# Patient Record
Sex: Female | Born: 1955 | Race: Black or African American | Hispanic: No | Smoking: Never smoker
Health system: Southern US, Community
[De-identification: ages and names within clinical notes are randomized; demographics above are authoritative.]

## PROBLEM LIST (undated history)

## (undated) DIAGNOSIS — E7800 Pure hypercholesterolemia, unspecified: Secondary | ICD-10-CM

## (undated) DIAGNOSIS — J40 Bronchitis, not specified as acute or chronic: Secondary | ICD-10-CM

## (undated) HISTORY — PX: OTHER SURGICAL HISTORY: SHX169

---

## 2018-09-27 ENCOUNTER — Encounter (HOSPITAL_BASED_OUTPATIENT_CLINIC_OR_DEPARTMENT_OTHER): Payer: Self-pay | Admitting: Emergency Medicine

## 2018-09-27 ENCOUNTER — Other Ambulatory Visit: Payer: Self-pay

## 2018-09-27 ENCOUNTER — Emergency Department (HOSPITAL_BASED_OUTPATIENT_CLINIC_OR_DEPARTMENT_OTHER)
Admission: EM | Admit: 2018-09-27 | Discharge: 2018-09-27 | Disposition: A | Discharge status: Home / Self Care | Payer: Self-pay | Attending: Emergency Medicine | Admitting: Emergency Medicine

## 2018-09-27 DIAGNOSIS — M545 Low back pain, unspecified: Secondary | ICD-10-CM

## 2018-09-27 HISTORY — DX: Pure hypercholesterolemia, unspecified: E78.00

## 2018-09-27 LAB — URINALYSIS, ROUTINE W REFLEX MICROSCOPIC
Bilirubin Urine: NEGATIVE
Glucose, UA: NEGATIVE mg/dL
Hgb urine dipstick: NEGATIVE
Ketones, ur: NEGATIVE mg/dL
Leukocytes, UA: NEGATIVE
Nitrite: NEGATIVE
Protein, ur: NEGATIVE mg/dL
Specific Gravity, Urine: 1.025 (ref 1.005–1.030)
pH: 5.5 (ref 5.0–8.0)

## 2018-09-27 MED ORDER — NAPROXEN 500 MG PO TABS: 500.0000 mg | ORAL_TABLET | Freq: Two times a day (BID) | ORAL | 0 refills | Status: DC

## 2018-09-27 MED ORDER — IBUPROFEN 400 MG PO TABS
400.0000 mg | ORAL_TABLET | Freq: Once | ORAL | Status: AC
  Administered 2018-09-27: 400 mg via ORAL
  Filled 2018-09-27: qty 1

## 2018-09-27 NOTE — Discharge Instructions (Addendum)
Today you were evaluated for back pain.  1. Medications: naproxen, usual home medications 2. Treatment: rest, drink plenty of fluids, gentle stretching as discussed, alternate ice and heat 3. Follow Up: Please follow up with your primary doctor in 3 days for discussion of your diagnoses and further evaluation after today's visit; if you do not have a primary care doctor use the resource guide provided to find one;  Return to the ER for worsening back pain, difficulty walking, loss of bowel or bladder control or other concerning symptoms. Follow up with orthopedics if not improving.  I have prescribed you an anti-inflammatory medication.   Naproxen is a nonsteroidal anti-inflammatory medication that will help with pain and swelling. Be sure to take this medication as prescribed with food, 1 pill every 12 hours,  It should be taken with food, as it can cause stomach upset, and more seriously, stomach bleeding. Do not take other nonsteroidal anti-inflammatory medications with this such as Advil, Motrin, or Aleve.   In addition you may also take Tylenol. Tylenol is generally safe, though you should not take more than 8 of the extra strength (500mg ) pills a day.  The application of heat can help soothe the pain.  Maintaining your daily activities, including walking, is encourged, as it will help you get better faster than just staying in bed.  Low back pain is discomfort in the lower back that may be due to injuries to muscles and ligaments around the spine.  Occasionally, it may be caused by a a problem to a part of the spine called a disc.  The pain may last several days or a few weeks.   Your pain should get better over the next 2 weeks.  You will need to follow up with your primary healthcare provider in 1-2 weeks for reassessment, if you do not have a primary care provider one is provided in your discharge instructions. However if you develop severe or worsening pain, low back pain with fever,  numbness, weakness, loss of bowel or bladder control, or inability to walk or urinate, you should return to the ER immediately.  Please follow up with your doctor this week for a recheck if still having symptoms.  Back Pain:  Your back pain should be treated with medicines such as ibuprofen or aleve and this back pain should get better over the next 2 weeks.  However if you develop severe or worsening pain, low back pain with fever, numbness, weakness or inability to walk or urinate, you should return to the ER immediately.  Please follow up with your doctor this week for a recheck if still having symptoms.  Low back pain is discomfort in the lower back that may be due to injuries to muscles and ligaments around the spine.  Occasionally, it may be caused by a a problem to a part of the spine called a disc.  The pain may last several days or a week;  However, most patients get completely well in 4 weeks.  Low back pain is very common. About 1 in 5 people have back pain. The cause of low back pain is rarely dangerous. The pain often gets better over time. About half of people with a sudden onset of back pain feel better in just 2 weeks. About 8 in 10 people feel better by 6 weeks.   CAUSES Some common causes of back pain include: Strain of the muscles or ligaments supporting the spine.  Wear and tear (degeneration) of the spinal  discs.  Arthritis.  Direct injury to the back.   DIAGNOSIS Most of the time, the direct cause of low back pain is not known. However, back pain can be treated effectively even when the exact cause of the pain is unknown. Answering your caregiver's questions about your overall health and symptoms is one of the most accurate ways to make sure the cause of your pain is not dangerous. If your caregiver needs more information, he or she may order lab work or imaging tests (X-rays or MRIs). However, even if imaging tests show changes in your back, this usually does not require  surgery.  HOME CARE INSTRUCTIONS For many people, back pain returns. Since low back pain is rarely dangerous, it is often a condition that people can learn to manage on their own.  Remain active. It is stressful on the back to sit or stand in one place. Do not sit, drive, or stand in one place for more than 30 minutes at a time. Take short walks on level surfaces as soon as pain allows. Try to increase the length of time you walk each day.  Do not stay in bed. Resting more than 1 or 2 days can delay your recovery.  Do not avoid exercise or work. Your body is made to move. It is not dangerous to be active, even though your back may hurt. Your back will likely heal faster if you return to being active before your pain is gone.  Pay attention to your body when you  bend and lift. Many people have less discomfort when lifting if they bend their knees, keep the load close to their bodies, and avoid twisting. Often, the most comfortable positions are those that put less stress on your recovering back.  Find a comfortable position to sleep. Use a firm mattress and lie on your side with your knees slightly bent. If you lie on your back, put a pillow under your knees.  Only take over-the-counter or prescription medicines as directed by your caregiver. Over-the-counter medicines to reduce pain and inflammation are often the most helpful. Your caregiver may prescribe muscle relaxant drugs. These medicines help dull your pain so you can more quickly return to your normal activities and healthy exercise.  Put ice on the injured area.  Put ice in a plastic bag.  Place a towel between your skin and the bag.  Leave the ice on for 15 to 20 minutes, 3 to 4 times a day for the first 2 to 3 days. After that, ice and heat may be alternated to reduce pain and spasms.  Ask your caregiver about trying back exercises and gentle massage. This may be of some benefit.  Avoid feeling anxious or stressed. Stress increases muscle  tension and can worsen back pain. It is important to recognize when you are anxious or stressed and learn ways to manage it. Exercise is a great option.   SEEK MEDICAL CARE IF: You have pain that is not relieved with rest or medicine.  You have pain that does not improve in 1 week.  You have new symptoms.  You are generally not feeling well.   SEEK IMMEDIATE MEDICAL CARE IF:  You have pain that radiates from your back into your legs.  You develop new bowel or bladder control problems.  You have unusual weakness or numbness in your arms or legs.  You develop nausea or vomiting.  You develop abdominal pain.  You feel faint.   Be aware that  if you develop new symptoms, such as a fever, leg weakness, difficulty with or loss of control of your urine or bowels, abdominal pain, or more severe pain, you will need to seek medical attention and  / or return to the Emergency department.  If you do not have a doctor see the list below.

## 2018-09-27 NOTE — ED Provider Notes (Signed)
[facility] EMERGENCY DEPARTMENT Provider Note   CSN: [id number] Arrival date & time: 09/27/18  1218   History   Chief Complaint Chief Complaint  Patient presents with  . Back Pain    HPI Patricia Spencer is a 62 y.o. female presenting with intermittent non radiating low back pain onset 3 days ago. Patient describes her back pain as an ache and states it is worse with twisting. Patient states she has tried advil with partial relief. Patient states back pain began after she had her birthday party and played with her grandchildren. Patient denies any trauma. Patient denies dysuria, fever, abdominal pain, nausea, vomiting, diarrhea, or trouble walking. Denies numbness, tingling, weakness, incontinence to bowel/bladder, fever, chills, IV drug use, or hx of cancer.    HPI  Past Medical History:  Diagnosis Date  . High cholesterol     There are no active problems to display for this patient.   History reviewed. No pertinent surgical history.   OB History   No obstetric history on file.      Home Medications    Prior to Admission medications   Medication Sig Start Date End Date Taking? Authorizing Provider  naproxen (NAPROSYN) 500 MG tablet Take 1 tablet (500 mg total) by mouth 2 (two) times daily. 09/27/18   Leretha DykesHernandez, Bowen Kia P, PA-C    Family History No family history on file.  Social History Social History   Tobacco Use  . Smoking status: Never Smoker  . Smokeless tobacco: Never Used  Substance Use Topics  . Alcohol use: Not Currently  . Drug use: Never     Allergies   Patient has no known allergies.   Review of Systems Review of Systems  Constitutional: Negative for activity change, chills, diaphoresis, fever and unexpected weight change.  Respiratory: Negative for cough and shortness of breath.   Cardiovascular: Negative for chest pain, palpitations and leg swelling.  Gastrointestinal: Negative for abdominal pain, constipation, diarrhea, nausea  and vomiting.  Genitourinary: Negative for difficulty urinating, dysuria, flank pain and hematuria.  Musculoskeletal: Positive for back pain. Negative for arthralgias, gait problem, joint swelling, myalgias, neck pain and neck stiffness.  Skin: Negative for rash.  Allergic/Immunologic: Negative for immunocompromised state.  Neurological: Negative for dizziness, syncope, weakness and numbness.  Hematological: Does not bruise/bleed easily.     Physical Exam Updated Vital Signs BP (!) 135/93 (BP Location: Right Arm)   Pulse 79   Temp 98.1 F (36.7 C) (Oral)   Resp 14   Ht 5\' 7"  (1.702 m)   Wt 68.9 kg   SpO2 100%   BMI 23.81 kg/m   Physical Exam Vitals signs and nursing note reviewed.  Constitutional:      General: She is not in acute distress.    Appearance: She is well-developed. She is not diaphoretic.  HENT:     Head: Normocephalic and atraumatic.  Cardiovascular:     Rate and Rhythm: Normal rate and regular rhythm.     Heart sounds: Normal heart sounds. No murmur. No friction rub. No gallop.   Pulmonary:     Effort: Pulmonary effort is normal. No respiratory distress.     Breath sounds: Normal breath sounds. No wheezing or rales.  Musculoskeletal: Normal range of motion.     Cervical back: Normal.     Thoracic back: Normal.     Lumbar back: She exhibits tenderness. She exhibits normal range of motion, no bony tenderness and no swelling.     Comments: No signs of  erythema or edema noted on exam. No midline tenderness noted on thoracic or lumbar spine. Mild paraspinal tenderness bilaterally on lumbar muscles. Patient has full ROM with flexion and extension of spine. 5/5 strength with dorsiflexion and plantar flexion. Negative straight leg test. 2+DP pulses. Sensation is intact. Patient is able to ambulate without difficulty.   Skin:    Findings: No erythema or rash.  Neurological:     Mental Status: She is alert and oriented to person, place, and time.      ED  Treatments / Results  Labs (all labs ordered are listed, but only abnormal results are displayed) Labs Reviewed  URINALYSIS, ROUTINE W REFLEX MICROSCOPIC    EKG None  Radiology No results found.  Procedures Procedures (including critical care time)  Medications Ordered in ED Medications  ibuprofen (ADVIL,MOTRIN) tablet 400 mg (400 mg Oral Given 09/27/18 1445)     Initial Impression / Assessment and Plan / ED Course  I have reviewed the triage vital signs and the nursing notes.  Pertinent labs & imaging results that were available during my care of the patient were reviewed by me and considered in my medical decision making (see chart for details).    Patient with back pain.  Suspect back pain is musculoskeletal due to history and physical exam. No neurological deficits and normal neuro exam.  Patient can walk without pain.  No loss of bowel or bladder control.  No concern for cauda equina.  No fever, night sweats, weight loss, h/o cancer, IVDU. UA is negative. RICE protocol and pain medicine indicated and discussed with patient.    Final Clinical Impressions(s) / ED Diagnoses   Final diagnoses:  Acute bilateral low back pain without sciatica    ED Discharge Orders         Ordered    naproxen (NAPROSYN) 500 MG tablet  2 times daily     09/27/18 1448           Carlyle Basques [place], [place] 09/27/18 1449    Rolan Bucco, MD 09/27/18 [phone]

## 2018-09-27 NOTE — ED Triage Notes (Addendum)
Low back pain x 3 days. Denies injury. Took advil 1 hour ago. Concerned about UTI, denies urinary symptoms.

## 2018-10-25 ENCOUNTER — Emergency Department (HOSPITAL_COMMUNITY)
Admission: EM | Admit: 2018-10-25 | Discharge: 2018-10-25 | Disposition: A | Discharge status: Home / Self Care | Payer: BLUE CROSS/BLUE SHIELD | Attending: Emergency Medicine | Admitting: Emergency Medicine

## 2018-10-25 ENCOUNTER — Encounter (HOSPITAL_COMMUNITY): Payer: Self-pay

## 2018-10-25 DIAGNOSIS — J111 Influenza due to unidentified influenza virus with other respiratory manifestations: Secondary | ICD-10-CM

## 2018-10-25 DIAGNOSIS — Z79899 Other long term (current) drug therapy: Secondary | ICD-10-CM | POA: Diagnosis not present

## 2018-10-25 DIAGNOSIS — R69 Illness, unspecified: Secondary | ICD-10-CM

## 2018-10-25 DIAGNOSIS — J101 Influenza due to other identified influenza virus with other respiratory manifestations: Secondary | ICD-10-CM | POA: Diagnosis not present

## 2018-10-25 DIAGNOSIS — R05 Cough: Secondary | ICD-10-CM | POA: Diagnosis present

## 2018-10-25 MED ORDER — CETIRIZINE HCL 5 MG PO TABS: 5.0000 mg | ORAL_TABLET | Freq: Every day | ORAL | 0 refills | Status: DC

## 2018-10-25 MED ORDER — BENZONATATE 100 MG PO CAPS: 200.0000 mg | ORAL_CAPSULE | Freq: Three times a day (TID) | ORAL | 0 refills | Status: DC

## 2018-10-25 MED ORDER — ONDANSETRON 4 MG PO TBDP: 4.0000 mg | ORAL_TABLET | Freq: Three times a day (TID) | ORAL | 0 refills | Status: DC | PRN

## 2018-10-25 MED ORDER — FLUTICASONE PROPIONATE 50 MCG/ACT NA SUSP: 1.0000 | Freq: Every day | NASAL | 2 refills | Status: DC

## 2018-10-25 MED ORDER — ONDANSETRON HCL 4 MG PO TABS
4.0000 mg | ORAL_TABLET | Freq: Once | ORAL | Status: AC
  Administered 2018-10-25: 4 mg via ORAL
  Filled 2018-10-25: qty 1

## 2018-10-25 NOTE — ED Notes (Signed)
Patient verbalizes understanding of discharge instructions. Opportunity for questioning and answers were provided. Armband removed by staff, pt discharged from ED ambulatory.   

## 2018-10-25 NOTE — ED Provider Notes (Signed)
[facility]ENC[facility] Provider Note   CSN: [id number] Arrival date & time: 10/25/18  [id number]     History   Chief Complaint Chief Complaint  Patient presents with  . URI    HPI Patricia Spencer is a 63 y.o. female who presents to ED for 1 day history of influenza-like illness including nausea, body aches, chills, cough productive with mucus, sinus pain and pressure, rhinorrhea.  Sick contacts around her with similar symptoms.  She has not been taking any medications to help with her symptoms.  She not receive her influenza vaccine this year.  She denies any recent travel, vomiting, abdominal pain, shortness of breath, sore throat.  HPI  Past Medical History:  Diagnosis Date  . High cholesterol     There are no active problems to display for this patient.   History reviewed. No pertinent surgical history.   OB History   No obstetric history on file.      Home Medications    Prior to Admission medications   Medication Sig Start Date End Date Taking? Authorizing Provider  benzonatate (TESSALON) 100 MG capsule Take 2 capsules (200 mg total) by mouth every 8 (eight) hours. 10/25/18   Nelida Mandarino, PA-C  cetirizine (ZYRTEC) 5 MG tablet Take 1 tablet (5 mg total) by mouth daily. 10/25/18   Caio Devera, PA-C  fluticasone (FLONASE) 50 MCG/ACT nasal spray Place 1 spray into both nostrils daily. 10/25/18   Aries Townley, PA-C  naproxen (NAPROSYN) 500 MG tablet Take 1 tablet (500 mg total) by mouth 2 (two) times daily. 09/27/18   Carlyle BasquesHernandez, Ana P, PA-C  ondansetron (ZOFRAN ODT) 4 MG disintegrating tablet Take 1 tablet (4 mg total) by mouth every 8 (eight) hours as needed for nausea or vomiting. 10/25/18   Dietrich PatesKhatri, Arienne Gartin, PA-C    Family History No family history on file.  Social History Social History   Tobacco Use  . Smoking status: Never Smoker  . Smokeless tobacco: Never Used  Substance Use Topics  . Alcohol use: Not Currently  . Drug use: Never      Allergies   Patient has no known allergies.   Review of Systems Review of Systems  Constitutional: Positive for chills. Negative for fever.  HENT: Positive for ear pain, rhinorrhea, sinus pressure and sinus pain. Negative for congestion, sore throat and trouble swallowing.   Respiratory: Positive for cough.   Gastrointestinal: Positive for nausea. Negative for abdominal pain and vomiting.  Musculoskeletal: Positive for myalgias.     Physical Exam Updated Vital Signs BP (!) 162/100 (BP Location: Right Arm)   Pulse (!) 109   Temp 99.7 F (37.6 C) (Oral)   Resp 20   SpO2 97%   Physical Exam Vitals signs and nursing note reviewed.  Constitutional:      General: She is not in acute distress.    Appearance: She is well-developed. She is not diaphoretic.  HENT:     Head: Normocephalic and atraumatic.     Right Ear: A middle ear effusion is present.     Left Ear: A middle ear effusion is present.     Nose:     Right Sinus: Maxillary sinus tenderness present.     Left Sinus: Maxillary sinus tenderness present.     Mouth/Throat:     Pharynx: Oropharynx is clear. Uvula midline.     Tonsils: No tonsillar exudate or tonsillar abscesses.  Eyes:     General: No scleral icterus.    Conjunctiva/sclera: Conjunctivae  normal.  Neck:     Musculoskeletal: Normal range of motion.  Cardiovascular:     Rate and Rhythm: Normal rate and regular rhythm.     Heart sounds: Normal heart sounds.  Pulmonary:     Effort: Pulmonary effort is normal. No respiratory distress.     Breath sounds: Normal breath sounds.  Skin:    Findings: No rash.  Neurological:     Mental Status: She is alert.      ED Treatments / Results  Labs (all labs ordered are listed, but only abnormal results are displayed) Labs Reviewed - No data to display  EKG None  Radiology No results found.  Procedures Procedures (including critical care time)  Medications Ordered in ED Medications  ondansetron  (ZOFRAN) tablet 4 mg (has no administration in time range)     Initial Impression / Assessment and Plan / ED Course  I have reviewed the triage vital signs and the nursing notes.  Pertinent labs & imaging results that were available during my care of the patient were reviewed by me and considered in my medical decision making (see chart for details).     Patient with symptoms consistent with influenza.  Vitals are stable, low-grade fever.  No signs of dehydration, tolerating PO's.  Lungs are clear. Due to patient's presentation and physical exam a chest x-ray was not ordered bc likely diagnosis of flu.  Discussed the cost versus benefit of Tamiflu treatment with the patient.  The patient declines Tamiflu and would instead like symptomatic treatment.  Patient will be discharged with instructions to orally hydrate, rest, and use over-the-counter medications such as anti-inflammatories ibuprofen and Aleve for muscle aches and Tylenol for fever.  Patient will also be given symptomatic treatment.  Patient is hemodynamically stable, in NAD, and able to ambulate in the ED. Evaluation does not show pathology that would require ongoing emergent intervention or inpatient treatment. I explained the diagnosis to the patient. Pain has been managed and has no complaints prior to discharge. Patient is comfortable with above plan and is stable for discharge at this time. All questions were answered prior to disposition. Strict return precautions for returning to the ED were discussed. Encouraged follow up with PCP.    Portions of this note were generated with Scientist, clinical (histocompatibility and immunogenetics)Dragon dictation software. Dictation errors may occur despite best attempts at proofreading.  Final Clinical Impressions(s) / ED Diagnoses   Final diagnoses:  Influenza-like illness    ED Discharge Orders         Ordered    ondansetron (ZOFRAN ODT) 4 MG disintegrating tablet  Every 8 hours PRN     10/25/18 0814    fluticasone (FLONASE) 50 MCG/ACT  nasal spray  Daily     10/25/18 0814    cetirizine (ZYRTEC) 5 MG tablet  Daily     10/25/18 0814    benzonatate (TESSALON) 100 MG capsule  Every 8 hours     10/25/18 0814           Dietrich PatesKhatri, Castulo Scarpelli, PA-C 10/25/18 0816    Virgina Norfolkuratolo, Adam, DO 10/25/18 1039

## 2018-10-25 NOTE — Discharge Instructions (Addendum)
Take the medication as needed for your symptoms. Please make sure you are staying hydrated. Return to ED for worsening symptoms, abdominal pain, chest pain, shortness of breath, vomiting or coughing up blood.

## 2018-10-25 NOTE — ED Triage Notes (Signed)
Pt presents for evaluation of flu like symptoms starting yesterday. Chills, fever, nausea, runny nose.

## 2018-10-29 ENCOUNTER — Emergency Department (HOSPITAL_COMMUNITY): Payer: BLUE CROSS/BLUE SHIELD

## 2018-10-29 ENCOUNTER — Emergency Department (HOSPITAL_COMMUNITY)
Admission: EM | Admit: 2018-10-29 | Discharge: 2018-10-29 | Disposition: A | Discharge status: Home / Self Care | Payer: BLUE CROSS/BLUE SHIELD | Attending: Emergency Medicine | Admitting: Emergency Medicine

## 2018-10-29 DIAGNOSIS — R0981 Nasal congestion: Secondary | ICD-10-CM | POA: Diagnosis present

## 2018-10-29 DIAGNOSIS — E7800 Pure hypercholesterolemia, unspecified: Secondary | ICD-10-CM | POA: Insufficient documentation

## 2018-10-29 DIAGNOSIS — J069 Acute upper respiratory infection, unspecified: Secondary | ICD-10-CM | POA: Diagnosis not present

## 2018-10-29 NOTE — ED Provider Notes (Signed)
[facility] EMERGENCY DEPARTMENT Provider Note   CSN: [id number] Arrival date & time: 10/29/18  0845     History   Chief Complaint No chief complaint on file.   HPI Patricia Spencer is a 63 y.o. female.  HPI   63 year old female presents today with complaints of upper respiratory infection.  She notes symptoms started approximately 5 days ago rhinorrhea nasal congestion and cough.  She notes body aches.  She notes feeling hot at night.  No medications prior to arrival.  Patient notes that she has been using antibiotics that she has had at home and prednisone.  Past Medical History:  Diagnosis Date  . High cholesterol     There are no active problems to display for this patient.   No past surgical history on file.   OB History   No obstetric history on file.      Home Medications    Prior to Admission medications   Medication Sig Start Date End Date Taking? Authorizing Provider  benzonatate (TESSALON) 100 MG capsule Take 2 capsules (200 mg total) by mouth every 8 (eight) hours. 10/25/18   Khatri, Hina, PA-C  cetirizine (ZYRTEC) 5 MG tablet Take 1 tablet (5 mg total) by mouth daily. 10/25/18   Khatri, Hina, PA-C  fluticasone (FLONASE) 50 MCG/ACT nasal spray Place 1 spray into both nostrils daily. 10/25/18   Khatri, Hina, PA-C  naproxen (NAPROSYN) 500 MG tablet Take 1 tablet (500 mg total) by mouth 2 (two) times daily. 09/27/18   Carlyle Basques P, PA-C  ondansetron (ZOFRAN ODT) 4 MG disintegrating tablet Take 1 tablet (4 mg total) by mouth every 8 (eight) hours as needed for nausea or vomiting. 10/25/18   Dietrich Pates, PA-C    Family History No family history on file.  Social History Social History   Tobacco Use  . Smoking status: Never Smoker  . Smokeless tobacco: Never Used  Substance Use Topics  . Alcohol use: Not Currently  . Drug use: Never     Allergies   Patient has no known allergies.   Review of Systems Review of Systems  All  other systems reviewed and are negative.    Physical Exam Updated Vital Signs BP 139/83 (BP Location: Left Arm)   Pulse 97   Temp 98.2 F (36.8 C) (Oral)   Resp 18   SpO2 99%   Physical Exam Vitals signs and nursing note reviewed.  Constitutional:      Appearance: She is well-developed.  HENT:     Head: Normocephalic and atraumatic.     Comments: Oropharynx clear no erythema edema or exudate Eyes:     General: No scleral icterus.       Right eye: No discharge.        Left eye: No discharge.     Conjunctiva/sclera: Conjunctivae normal.     Pupils: Pupils are equal, round, and reactive to light.  Neck:     Musculoskeletal: Normal range of motion.     Vascular: No JVD.     Trachea: No tracheal deviation.  Pulmonary:     Effort: Pulmonary effort is normal. No respiratory distress.     Breath sounds: No stridor. No wheezing, rhonchi or rales.  Neurological:     Mental Status: She is alert and oriented to person, place, and time.     Coordination: Coordination normal.  Psychiatric:        Behavior: Behavior normal.        Thought Content: Thought content  normal.        Judgment: Judgment normal.      ED Treatments / Results  Labs (all labs ordered are listed, but only abnormal results are displayed) Labs Reviewed - No data to display  EKG None  Radiology Dg Chest 2 View  Result Date: 10/29/2018 CLINICAL DATA:  Cough and chest pain EXAM: CHEST - 2 VIEW COMPARISON:  None. FINDINGS: There is slight scarring in the lateral bases. Lungs elsewhere are clear. The heart size and pulmonary vascularity are normal. No adenopathy. No bone lesions. No pneumothorax. IMPRESSION: No edema or consolidation.  No evident adenopathy. Electronically Signed   By: Bretta Bang III M.D.   On: 10/29/2018 09:44    Procedures Procedures (including critical care time)  Medications Ordered in ED Medications - No data to display   Initial Impression / Assessment and Plan / ED Course   I have reviewed the triage vital signs and the nursing notes.  Pertinent labs & imaging results that were available during my care of the patient were reviewed by me and considered in my medical decision making (see chart for details).     Patient presents with likely viral URI.  She has no objective signs of infection presently negative chest x-ray.  Patient discharged with outpatient follow-up.  I did have a discussion with the patient and the need for a primary care provider and utilization of their resources.  Final Clinical Impressions(s) / ED Diagnoses   Final diagnoses:  Viral upper respiratory tract infection    ED Discharge Orders    None       Rosalio Loud 10/29/18 1012    Jacalyn Lefevre, MD 10/29/18 1235

## 2018-10-29 NOTE — ED Triage Notes (Signed)
Pt back to today to make sure she does not have PNA , pt was here 3 days ago but states that she is not feeling any better

## 2018-10-29 NOTE — Discharge Instructions (Addendum)
Please read attached information. If you experience any new or worsening signs or symptoms please return to the emergency room for evaluation. Please follow-up with your primary care provider or specialist as discussed.  °

## 2018-12-10 ENCOUNTER — Encounter (HOSPITAL_BASED_OUTPATIENT_CLINIC_OR_DEPARTMENT_OTHER): Payer: Self-pay | Admitting: *Deleted

## 2018-12-10 ENCOUNTER — Emergency Department (HOSPITAL_BASED_OUTPATIENT_CLINIC_OR_DEPARTMENT_OTHER): Payer: BLUE CROSS/BLUE SHIELD

## 2018-12-10 ENCOUNTER — Other Ambulatory Visit: Payer: Self-pay

## 2018-12-10 ENCOUNTER — Emergency Department (HOSPITAL_BASED_OUTPATIENT_CLINIC_OR_DEPARTMENT_OTHER)
Admission: EM | Admit: 2018-12-10 | Discharge: 2018-12-10 | Disposition: A | Discharge status: Home / Self Care | Payer: BLUE CROSS/BLUE SHIELD | Attending: Emergency Medicine | Admitting: Emergency Medicine

## 2018-12-10 DIAGNOSIS — W19XXXA Unspecified fall, initial encounter: Secondary | ICD-10-CM

## 2018-12-10 DIAGNOSIS — S0990XA Unspecified injury of head, initial encounter: Secondary | ICD-10-CM | POA: Insufficient documentation

## 2018-12-10 DIAGNOSIS — Y9389 Activity, other specified: Secondary | ICD-10-CM | POA: Insufficient documentation

## 2018-12-10 DIAGNOSIS — Y929 Unspecified place or not applicable: Secondary | ICD-10-CM | POA: Insufficient documentation

## 2018-12-10 DIAGNOSIS — Y999 Unspecified external cause status: Secondary | ICD-10-CM | POA: Insufficient documentation

## 2018-12-10 DIAGNOSIS — W1789XA Other fall from one level to another, initial encounter: Secondary | ICD-10-CM | POA: Insufficient documentation

## 2018-12-10 HISTORY — DX: Bronchitis, not specified as acute or chronic: J40

## 2018-12-10 NOTE — ED Provider Notes (Signed)
[facility] EMERGENCY DEPARTMENT Provider Note   CSN: [id number] Arrival date & time: 12/10/18  1824    History   Chief Complaint Chief Complaint  Patient presents with  . Fall    HPI Patricia Spencer is a 63 y.o. female who presents today for evaluation after a fall.  She reports that she was not wearing a helmet when she was riding on her grandchild's hover board and lost her balance falling back striking her head.  Reportedly initially her words were garbled and slurred, this lasted for approximately 30 seconds according to patient and her grandson.  She did not have loss of consciousness.  She does not take any blood thinning medications.  She reports pain in her head and in her left elbow stating that she struck her left elbow.  She has been ambulatory since and was able to drive here.     HPI  Past Medical History:  Diagnosis Date  . Bronchitis   . High cholesterol     There are no active problems to display for this patient.   History reviewed. No pertinent surgical history.   OB History   No obstetric history on file.      Home Medications    Prior to Admission medications   Medication Sig Start Date End Date Taking? Authorizing Provider  benzonatate (TESSALON) 100 MG capsule Take 2 capsules (200 mg total) by mouth every 8 (eight) hours. 10/25/18   Khatri, Hina, PA-C  cetirizine (ZYRTEC) 5 MG tablet Take 1 tablet (5 mg total) by mouth daily. 10/25/18   Khatri, Hina, PA-C  fluticasone (FLONASE) 50 MCG/ACT nasal spray Place 1 spray into both nostrils daily. 10/25/18   Khatri, Hina, PA-C  naproxen (NAPROSYN) 500 MG tablet Take 1 tablet (500 mg total) by mouth 2 (two) times daily. 09/27/18   Carlyle Basques P, PA-C  ondansetron (ZOFRAN ODT) 4 MG disintegrating tablet Take 1 tablet (4 mg total) by mouth every 8 (eight) hours as needed for nausea or vomiting. 10/25/18   Dietrich Pates, PA-C    Family History No family history on file.  Social History Social  History   Tobacco Use  . Smoking status: Never Smoker  . Smokeless tobacco: Never Used  Substance Use Topics  . Alcohol use: Not Currently  . Drug use: Never     Allergies   Patient has no known allergies.   Review of Systems Review of Systems  Constitutional: Negative for chills and fever.  Eyes: Negative for pain and visual disturbance.  Neurological: Positive for speech difficulty and headaches. Negative for dizziness.  All other systems reviewed and are negative.    Physical Exam Updated Vital Signs BP (!) 168/88 (BP Location: Right Arm)   Pulse 98   Temp 98.4 F (36.9 C) (Oral)   Resp 16   Ht 5\' 8"  (1.727 m)   Wt 69.4 kg   SpO2 100%   BMI 23.26 kg/m   Physical Exam Vitals signs and nursing note reviewed.  Constitutional:      General: She is not in acute distress.    Appearance: She is well-developed. She is not ill-appearing.  HENT:     Head: Normocephalic and atraumatic.     Comments: No raccoon's eyes or battle signs bilaterally.    Right Ear: Tympanic membrane normal.     Left Ear: Tympanic membrane normal.     Nose: Nose normal.     Mouth/Throat:     Mouth: Mucous membranes are moist.  Eyes:     Conjunctiva/sclera: Conjunctivae normal.  Neck:     Musculoskeletal: Normal range of motion and neck supple. No neck rigidity.  Cardiovascular:     Rate and Rhythm: Normal rate and regular rhythm.     Pulses: Normal pulses.     Heart sounds: No murmur.  Pulmonary:     Effort: Pulmonary effort is normal. No respiratory distress.     Breath sounds: Normal breath sounds.  Abdominal:     Palpations: Abdomen is soft.     Tenderness: There is no abdominal tenderness.  Musculoskeletal:     Comments: Patient has full range of motion of left elbow without crepitus or deformity.  Skin:    General: Skin is warm and dry.  Neurological:     General: No focal deficit present.     Mental Status: She is alert and oriented to person, place, and time.      Comments: Mental Status:  Alert, oriented, thought content appropriate, able to give a coherent history. Speech fluent without evidence of aphasia. Able to follow 2 step commands without difficulty.  Cranial Nerves:  II:  Peripheral visual fields grossly normal, pupils equal, round, reactive to light III,IV, VI: ptosis not present, extra-ocular motions intact bilaterally  V,VII: smile symmetric, facial light touch sensation equal VIII: hearing grossly normal to voice  X: uvula elevates symmetrically  XI: bilateral shoulder shrug symmetric and strong XII: midline tongue extension without fassiculations Motor:  Normal tone. 5/5 in upper and lower extremities bilaterally including strong and equal grip strength and dorsiflexion/plantar flexion Cerebellar: normal finger-to-nose with bilateral upper extremities Gait: normal gait and balance CV: distal pulses palpable throughout    Psychiatric:        Mood and Affect: Mood normal.        Behavior: Behavior normal.      ED Treatments / Results  Labs (all labs ordered are listed, but only abnormal results are displayed) Labs Reviewed - No data to display  EKG None  Radiology Ct Head Wo Contrast  Result Date: 12/10/2018 CLINICAL DATA:  Fall off hoverboard.  Hit head. EXAM: CT HEAD WITHOUT CONTRAST CT CERVICAL SPINE WITHOUT CONTRAST TECHNIQUE: Multidetector CT imaging of the head and cervical spine was performed following the standard protocol without intravenous contrast. Multiplanar CT image reconstructions of the cervical spine were also generated. COMPARISON:  None. FINDINGS: CT HEAD FINDINGS Brain: No acute intracranial abnormality. Specifically, no hemorrhage, hydrocephalus, mass lesion, acute infarction, or significant intracranial injury. Vascular: No hyperdense vessel or unexpected calcification. Skull: No acute calvarial abnormality. Sinuses/Orbits: Visualized paranasal sinuses and mastoids clear. Orbital soft tissues unremarkable.  Other: None CT CERVICAL SPINE FINDINGS Alignment: Normal alignment Skull base and vertebrae: No acute fracture. No primary bone lesion or focal pathologic process. Soft tissues and spinal canal: No prevertebral fluid or swelling. No visible canal hematoma. Disc levels: Degenerative disc disease at C5-6 with disc space narrowing and spurring. Diffuse degenerative facet disease bilaterally, left greater than right. Upper chest: No acute findings Other: None IMPRESSION: No intracranial abnormality. No acute bony abnormality in the cervical spine. Electronically Signed   By: Charlett Nose M.D.   On: 12/10/2018 19:22   Ct Cervical Spine Wo Contrast  Result Date: 12/10/2018 CLINICAL DATA:  Fall off hoverboard.  Hit head. EXAM: CT HEAD WITHOUT CONTRAST CT CERVICAL SPINE WITHOUT CONTRAST TECHNIQUE: Multidetector CT imaging of the head and cervical spine was performed following the standard protocol without intravenous contrast. Multiplanar CT image reconstructions of the cervical  spine were also generated. COMPARISON:  None. FINDINGS: CT HEAD FINDINGS Brain: No acute intracranial abnormality. Specifically, no hemorrhage, hydrocephalus, mass lesion, acute infarction, or significant intracranial injury. Vascular: No hyperdense vessel or unexpected calcification. Skull: No acute calvarial abnormality. Sinuses/Orbits: Visualized paranasal sinuses and mastoids clear. Orbital soft tissues unremarkable. Other: None CT CERVICAL SPINE FINDINGS Alignment: Normal alignment Skull base and vertebrae: No acute fracture. No primary bone lesion or focal pathologic process. Soft tissues and spinal canal: No prevertebral fluid or swelling. No visible canal hematoma. Disc levels: Degenerative disc disease at C5-6 with disc space narrowing and spurring. Diffuse degenerative facet disease bilaterally, left greater than right. Upper chest: No acute findings Other: None IMPRESSION: No intracranial abnormality. No acute bony abnormality in the  cervical spine. Electronically Signed   By: Charlett Nose M.D.   On: 12/10/2018 19:22    Procedures Procedures (including critical care time)  Medications Ordered in ED Medications - No data to display   Initial Impression / Assessment and Plan / ED Course  I have reviewed the triage vital signs and the nursing notes.  Pertinent labs & imaging results that were available during my care of the patient were reviewed by me and considered in my medical decision making (see chart for details).       Patient presents today for evaluation after she fell off a hover board striking the back of her head.  She reports initially her words were garbled.  CT scan of head and neck were obtained without evidence of acute abnormality.  On exam she is neurologically intact.  Suspect concussion.  She is slightly hypertensive while in the emergency room, she was informed of this and the need to follow-up in the next week for recheck.  She is given information for the concussion clinic.  Return precautions were discussed with patient who states their understanding.  At the time of discharge patient denied any unaddressed complaints or concerns.  Patient is agreeable for discharge home.   Final Clinical Impressions(s) / ED Diagnoses   Final diagnoses:  Fall, initial encounter  Injury of head, initial encounter    ED Discharge Orders    None       Norman Clay 12/11/18 0002    Geoffery Lyons, MD 12/13/18 1108

## 2018-12-10 NOTE — ED Triage Notes (Signed)
On hoover board and fell backwards and hit back of head on concrete  Denies loc  Jumped as soon as fell  States words were garbled for a few minutes  Also left elbow pain

## 2018-12-10 NOTE — Discharge Instructions (Signed)
Please do not use a hover board.  If you choose to use 1 please make sure that you, and anyone else to use it, or wearing a helmet.  I have given you the information for the concussion clinic.  Please call them to schedule an appointment.  You may follow-up with your primary care doctor.  If your symptoms worsen or you have concerns please seek additional medical care and evaluation.

## 2018-12-11 ENCOUNTER — Telehealth: Payer: Self-pay

## 2018-12-11 ENCOUNTER — Ambulatory Visit: Payer: BLUE CROSS/BLUE SHIELD | Admitting: Family Medicine

## 2018-12-11 NOTE — Telephone Encounter (Signed)
Spoke with patient. She was on a hoverboard yesterday when she fell off backwards hitting posterior aspect of her head. She said that she did not lose consciousness but was slurring her speech when she tried to speak to her grandson. She had an intense headache and took some Advil which improved her pain. No headache today but wants to be seen to make sure she is ok. On schedule after lunch today.

## 2019-04-13 IMAGING — CT CT CERVICAL SPINE W/O CM
4 of 8 series · 13 of 33 positions shown, 14 images · non-contrast
Comparison: None.

CLINICAL DATA: Fall off hoverboard.  Hit head.

EXAM:
CT HEAD WITHOUT CONTRAST
CT CERVICAL SPINE WITHOUT CONTRAST
TECHNIQUE: Multidetector CT imaging of the head and cervical spine was
performed following the standard protocol without intravenous
contrast. Multiplanar CT image reconstructions of the cervical spine
were also generated.

[Series 4: head 3.0 mpr cor · coronal · 0.33mm/px · 3 of 69 slices shown]
[im 18/69  bone]
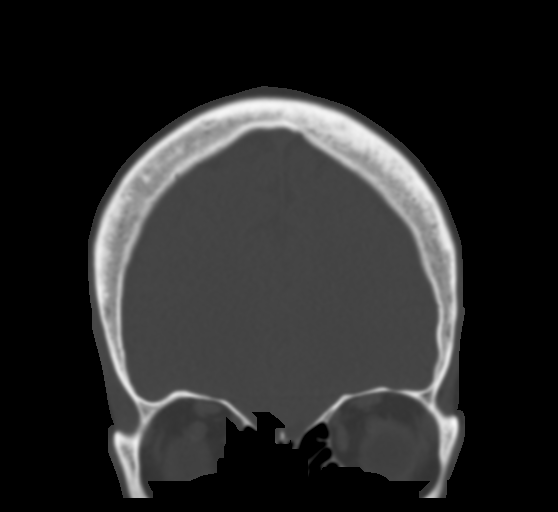
[im 35/69  bone]
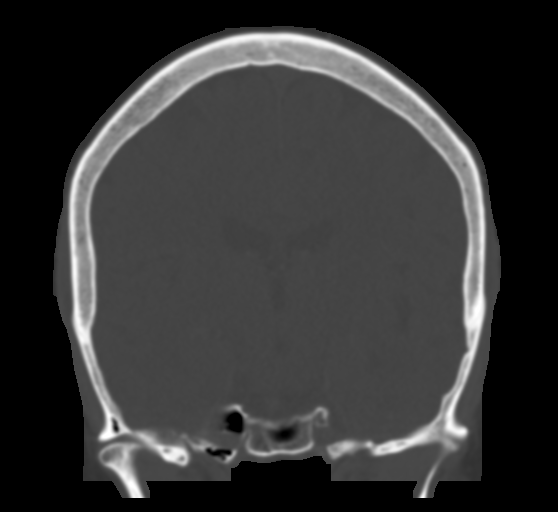
[im 52/69  bone]
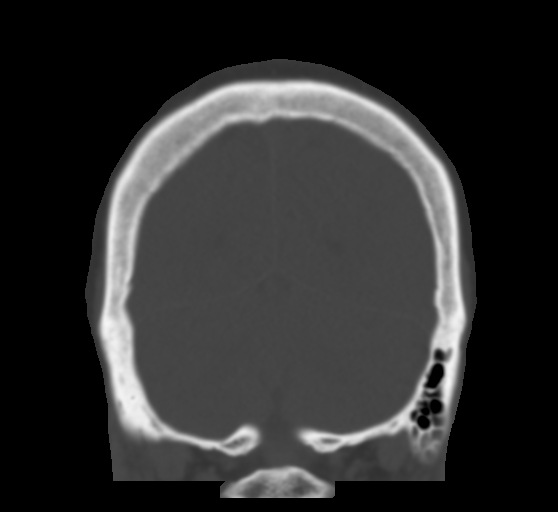

[Series 7: c_spine 2.0 i30s 3 · axial · 0.43mm/px · z∈[+1002,+1062]mm · 2 of 92 slices shown]
[im 31/92  bone]
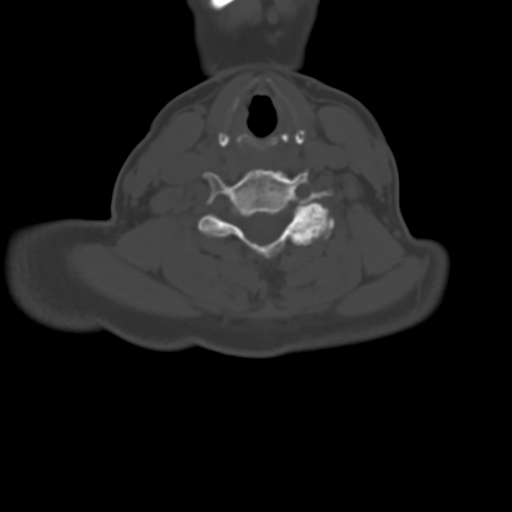
[im 61/92  bone]
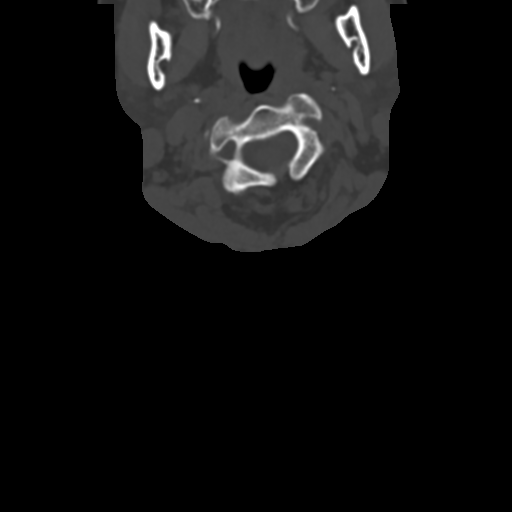

[Series 10: sagittals · sagittal · 0.26mm/px · 5 of 78 slices shown]
[im 13/78  bone]
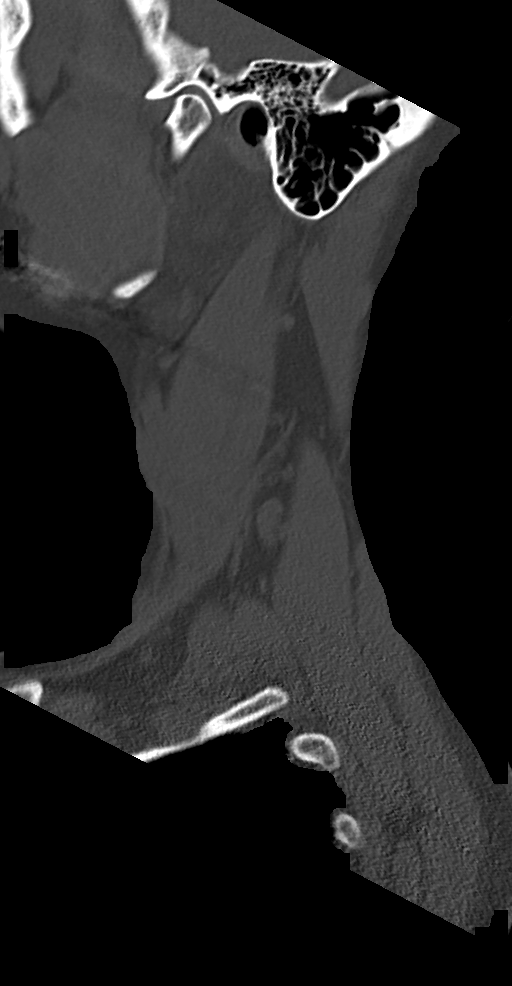
[im 26/78  bone]
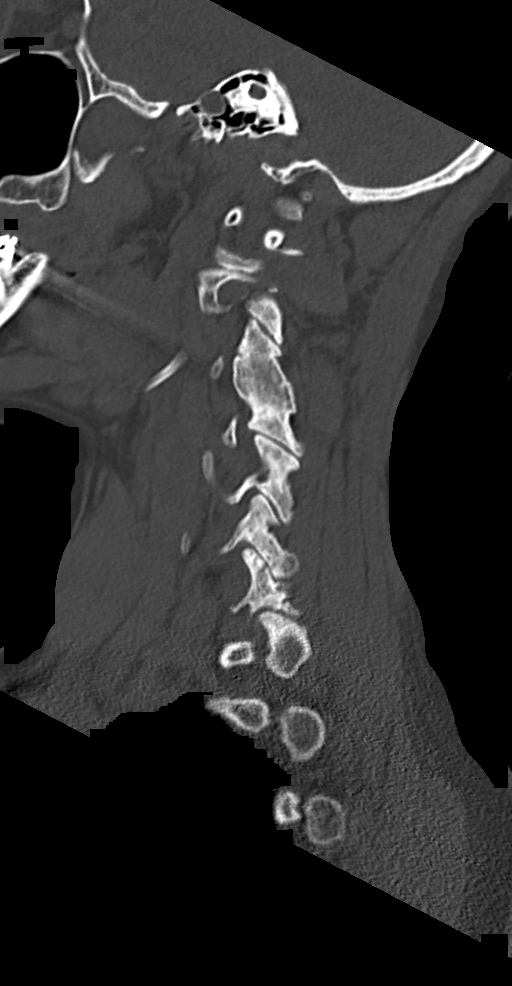
[im 39/78  bone]
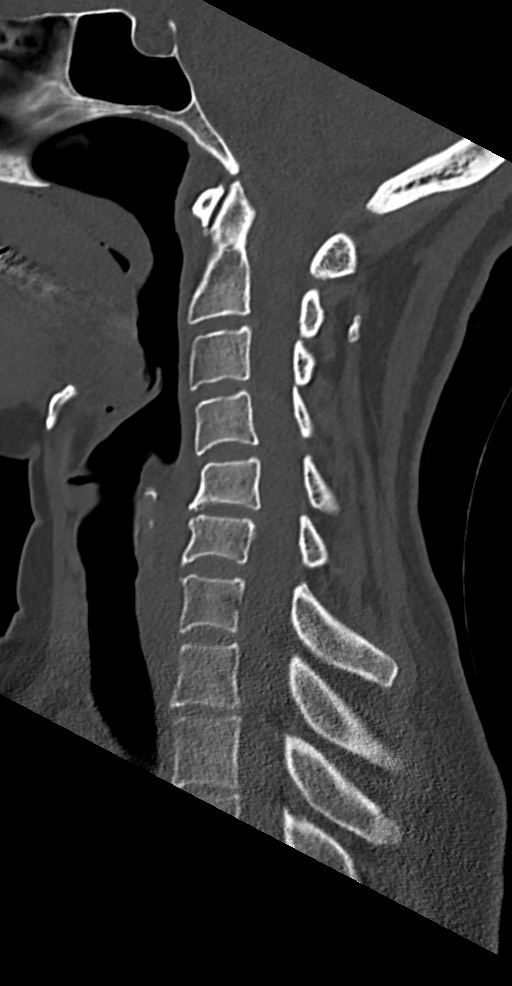
[im 52/78  bone]
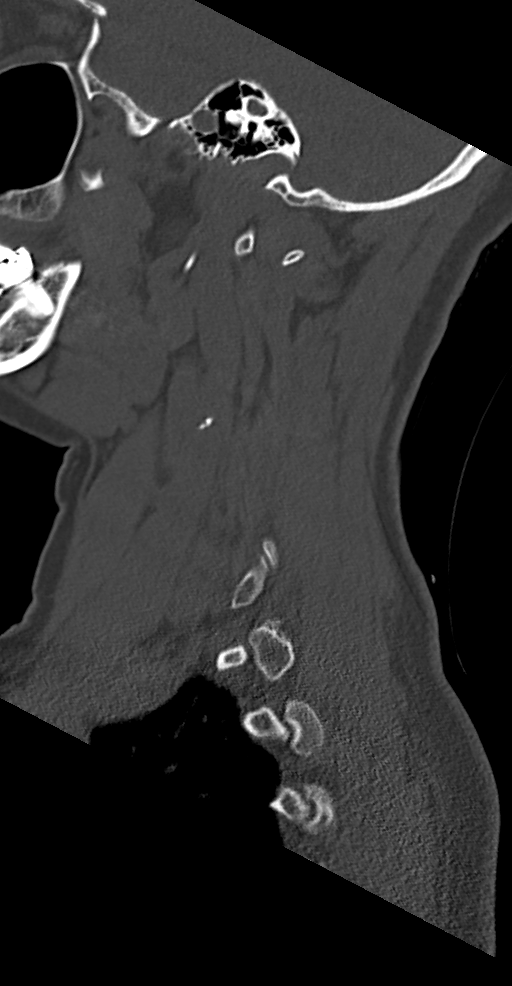
[im 65/78  bone]
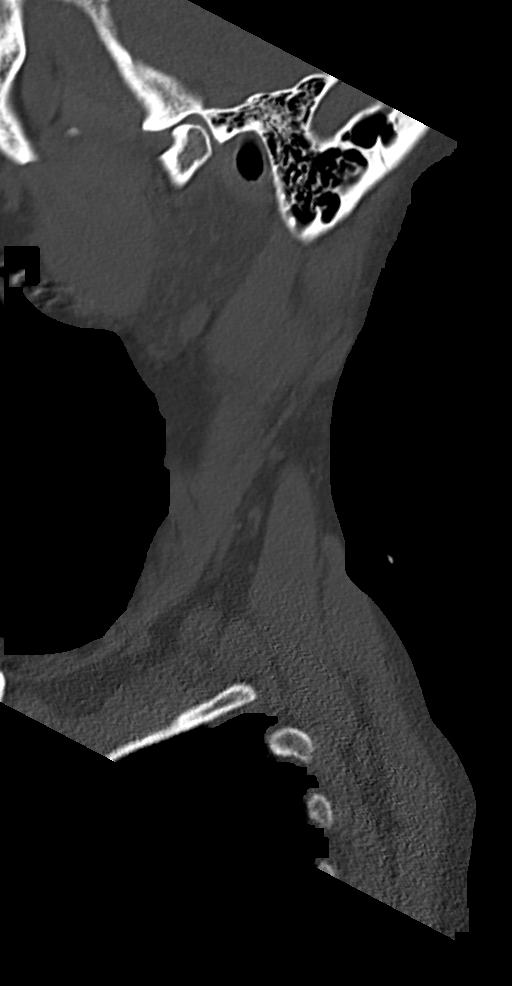

[Series 11: orthogonals · axial · 0.26mm/px · z∈[+935,+1046]mm · 3 of 127 slices shown, 4 images]
[im 32/127  soft-tissue]
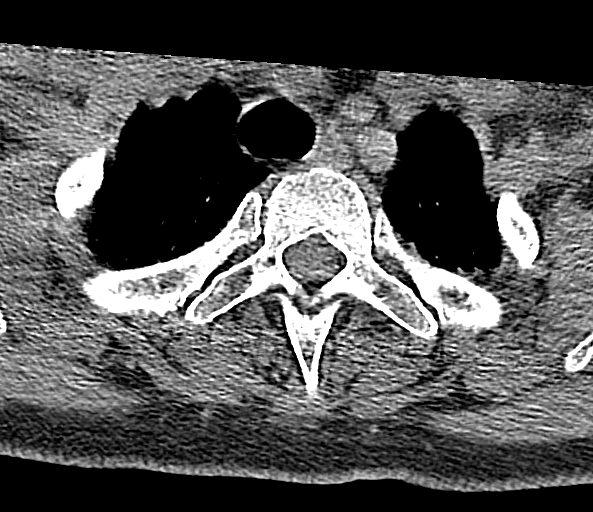
[im 32/127  bone]
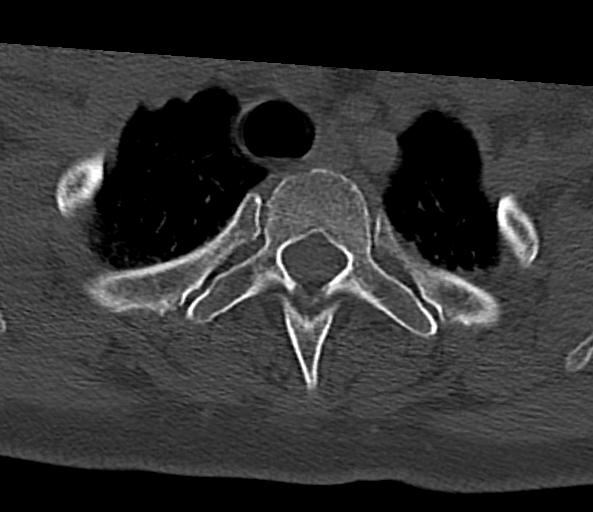
[im 64/127  bone]
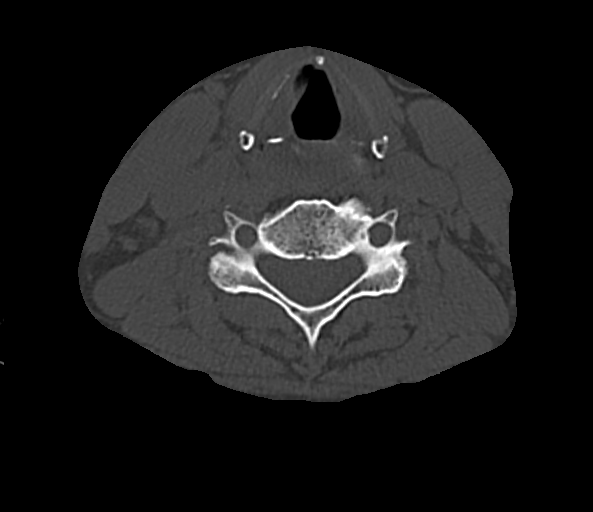
[im 95/127  bone]
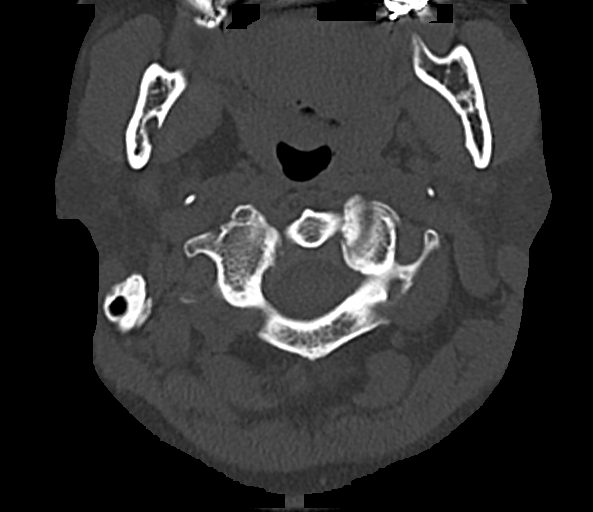

[13 of 33 positions shown; findings below may reference images not displayed]

FINDINGS: CT HEAD FINDINGS

Brain: No acute intracranial abnormality. Specifically, no
hemorrhage, hydrocephalus, mass lesion, acute infarction, or
significant intracranial injury.

Vascular: No hyperdense vessel or unexpected calcification.

Skull: No acute calvarial abnormality.

Sinuses/Orbits: Visualized paranasal sinuses and mastoids clear.
Orbital soft tissues unremarkable.

Other: None

CT CERVICAL SPINE FINDINGS

Alignment: Normal alignment

Skull base and vertebrae: No acute fracture. No primary bone lesion
or focal pathologic process.

Soft tissues and spinal canal: No prevertebral fluid or swelling. No
visible canal hematoma.

Disc levels: Degenerative disc disease at C5-6 with disc space
narrowing and spurring. Diffuse degenerative facet disease
bilaterally, left greater than right.

Upper chest: No acute findings

Other: None
IMPRESSION: No intracranial abnormality.

No acute bony abnormality in the cervical spine.

## 2019-07-21 ENCOUNTER — Other Ambulatory Visit: Payer: Self-pay

## 2019-07-21 ENCOUNTER — Emergency Department (HOSPITAL_BASED_OUTPATIENT_CLINIC_OR_DEPARTMENT_OTHER)
Admission: EM | Admit: 2019-07-21 | Discharge: 2019-07-21 | Disposition: A | Discharge status: Home / Self Care | Payer: BLUE CROSS/BLUE SHIELD | Attending: Emergency Medicine | Admitting: Emergency Medicine

## 2019-07-21 ENCOUNTER — Emergency Department (HOSPITAL_BASED_OUTPATIENT_CLINIC_OR_DEPARTMENT_OTHER): Payer: BLUE CROSS/BLUE SHIELD

## 2019-07-21 ENCOUNTER — Encounter (HOSPITAL_BASED_OUTPATIENT_CLINIC_OR_DEPARTMENT_OTHER): Payer: Self-pay | Admitting: Emergency Medicine

## 2019-07-21 DIAGNOSIS — R0789 Other chest pain: Secondary | ICD-10-CM | POA: Diagnosis not present

## 2019-07-21 DIAGNOSIS — F419 Anxiety disorder, unspecified: Secondary | ICD-10-CM | POA: Insufficient documentation

## 2019-07-21 DIAGNOSIS — R079 Chest pain, unspecified: Secondary | ICD-10-CM

## 2019-07-21 LAB — CBC
HCT: 44.6 % (ref 36.0–46.0)
Hemoglobin: 14.4 g/dL (ref 12.0–15.0)
MCH: 29.6 pg (ref 26.0–34.0)
MCHC: 32.3 g/dL (ref 30.0–36.0)
MCV: 91.8 fL (ref 80.0–100.0)
Platelets: 243 10*3/uL (ref 150–400)
RBC: 4.86 MIL/uL (ref 3.87–5.11)
RDW: 13.2 % (ref 11.5–15.5)
WBC: 5.4 10*3/uL (ref 4.0–10.5)
nRBC: 0 % (ref 0.0–0.2)

## 2019-07-21 LAB — BASIC METABOLIC PANEL
Anion gap: 10 (ref 5–15)
BUN: 12 mg/dL (ref 6–20)
CO2: 26 mmol/L (ref 22–32)
Calcium: 9 mg/dL (ref 8.9–10.3)
Chloride: 104 mmol/L (ref 98–111)
Creatinine, Ser: 0.87 mg/dL (ref 0.44–1.00)
GFR calc Af Amer: >60 mL/min (ref >60)
GFR calc non Af Amer: >60 mL/min (ref >60)
Glucose, Bld: 114 mg/dL — ABNORMAL HIGH (ref 70–99)
Potassium: 3.9 mmol/L (ref 3.5–5.1)
Sodium: 140 mmol/L (ref 135–145)

## 2019-07-21 LAB — TROPONIN I (HIGH SENSITIVITY)
Troponin I (High Sensitivity): 2 ng/L (ref <18)
Troponin I (High Sensitivity): 2 ng/L (ref <18)

## 2019-07-21 MED ORDER — ONDANSETRON 4 MG PO TBDP
4.0000 mg | ORAL_TABLET | Freq: Once | ORAL | Status: AC
  Administered 2019-07-21: 22:00:00 4 mg via ORAL

## 2019-07-21 MED ORDER — ONDANSETRON 4 MG PO TBDP
ORAL_TABLET | ORAL | Status: AC
  Filled 2019-07-21: qty 1

## 2019-07-21 MED ORDER — ONDANSETRON HCL 4 MG/2ML IJ SOLN: 4.0000 mg | Freq: Once | INTRAMUSCULAR | Status: DC

## 2019-07-21 MED ORDER — BUSPIRONE HCL 7.5 MG PO TABS: 7.5000 mg | ORAL_TABLET | Freq: Two times a day (BID) | ORAL | 0 refills | Status: DC

## 2019-07-21 MED ORDER — LORAZEPAM 1 MG PO TABS
0.5000 mg | ORAL_TABLET | Freq: Once | ORAL | Status: AC
  Administered 2019-07-21: 23:00:00 0.5 mg via ORAL
  Filled 2019-07-21: qty 1

## 2019-07-21 NOTE — Discharge Instructions (Signed)
Schedule appointment with your primary care doctor.  Consider seeing a mental health provider for your anxiety.

## 2019-07-21 NOTE — ED Notes (Signed)
ED Provider at bedside. 

## 2019-07-21 NOTE — ED Provider Notes (Signed)
[facility] EMERGENCY DEPARTMENT Provider Note   CSN: [id number] Arrival date & time: 07/21/19  1858     History   Chief Complaint Chief Complaint  Patient presents with  . Chest Pain    HPI Patricia Spencer is a 63 y.o. female.  HPI: A 63 year old patient with a history of hypercholesterolemia presents for evaluation of chest pain. Initial onset of pain was approximately 1-3 hours ago. The patient's chest pain is described as heaviness/pressure/tightness and is not worse with exertion. The patient complains of nausea. The patient's chest pain is middle- or left-sided, is not well-localized, is not sharp and does not radiate to the arms/jaw/neck. The patient denies diaphoresis. The patient has a family history of coronary artery disease in a first-degree relative with onset less than age 61. The patient has no history of stroke, has no history of peripheral artery disease, has not smoked in the past 90 days, denies any history of treated diabetes, is not hypertensive and does not have an elevated BMI (>=30).   HPI Pt was at home when she started to feel lightheaded when she was standing talking on the phone.  She felt funny but felt like she was having to reach for the floor with her feet.  She felt as if she might pass out. THat lasted several minutes but she did not pass out.  She then started having a pounding and pressure in her chest around 6 pm.  The sx lasted several minutes.      The sx slowly resolved on their own.  Pt focused on calming her thoughts.  WHen she was on the phone previously she was talking to her friend about feeling overwhelmed sometimes.  Her husband passed away last year. Past Medical History:  Diagnosis Date  . Bronchitis   . High cholesterol     There are no active problems to display for this patient.   History reviewed. No pertinent surgical history.   OB History   No obstetric history on file.      Home Medications    Prior to  Admission medications   Medication Sig Start Date End Date Taking? Authorizing Provider  benzonatate (TESSALON) 100 MG capsule Take 2 capsules (200 mg total) by mouth every 8 (eight) hours. 10/25/18   Khatri, Hina, PA-C  cetirizine (ZYRTEC) 5 MG tablet Take 1 tablet (5 mg total) by mouth daily. 10/25/18   Khatri, Hina, PA-C  fluticasone (FLONASE) 50 MCG/ACT nasal spray Place 1 spray into both nostrils daily. 10/25/18   Khatri, Hina, PA-C  naproxen (NAPROSYN) 500 MG tablet Take 1 tablet (500 mg total) by mouth 2 (two) times daily. 09/27/18   Carlyle BasquesHernandez, Ana P, PA-C  ondansetron (ZOFRAN ODT) 4 MG disintegrating tablet Take 1 tablet (4 mg total) by mouth every 8 (eight) hours as needed for nausea or vomiting. 10/25/18   Dietrich PatesKhatri, Hina, PA-C    Family History No family history on file.  Social History Social History   Tobacco Use  . Smoking status: Never Smoker  . Smokeless tobacco: Never Used  Substance Use Topics  . Alcohol use: Not Currently  . Drug use: Never     Allergies   Patient has no known allergies.   Review of Systems Review of Systems  All other systems reviewed and are negative.    Physical Exam Updated Vital Signs BP 134/84   Pulse 62   Temp 98.3 F (36.8 C) (Oral)   Resp 16   Ht 1.702 m (5'  7")   Wt 68 kg   SpO2 100%   BMI 23.49 kg/m   Physical Exam Vitals signs and nursing note reviewed.  Constitutional:      General: She is not in acute distress.    Appearance: She is well-developed.  HENT:     Head: Normocephalic and atraumatic.     Right Ear: External ear normal.     Left Ear: External ear normal.  Eyes:     General: No scleral icterus.       Right eye: No discharge.        Left eye: No discharge.     Conjunctiva/sclera: Conjunctivae normal.  Neck:     Musculoskeletal: Neck supple.     Trachea: No tracheal deviation.  Cardiovascular:     Rate and Rhythm: Normal rate and regular rhythm.  Pulmonary:     Effort: Pulmonary effort is normal. No  respiratory distress.     Breath sounds: Normal breath sounds. No stridor. No wheezing or rales.  Abdominal:     General: Bowel sounds are normal. There is no distension.     Palpations: Abdomen is soft.     Tenderness: There is no abdominal tenderness. There is no guarding or rebound.  Musculoskeletal:        General: No tenderness.  Skin:    General: Skin is warm and dry.     Findings: No rash.  Neurological:     Mental Status: She is alert.     Cranial Nerves: No cranial nerve deficit (no facial droop, extraocular movements intact, no slurred speech).     Sensory: No sensory deficit.     Motor: No abnormal muscle tone or seizure activity.     Coordination: Coordination normal.      ED Treatments / Results  Labs (all labs ordered are listed, but only abnormal results are displayed) Labs Reviewed  BASIC METABOLIC PANEL - Abnormal; Notable for the following components:      Result Value   Glucose, Bld 114 (*)    All other components within normal limits  CBC  TROPONIN I (HIGH SENSITIVITY)  TROPONIN I (HIGH SENSITIVITY)    EKG EKG Interpretation  Date/Time:  Tuesday July 21 2019 19:10:17 EDT Ventricular Rate:  101 PR Interval:  184 QRS Duration: 78 QT Interval:  384 QTC Calculation: 497 R Axis:   -14 Text Interpretation:  Sinus tachycardia Otherwise normal ECG No old tracing to compare Confirmed by Dorie Rank [phone]) on 07/21/2019 7:18:57 PM   Radiology Dg Chest 2 View  Result Date: 07/21/2019 CLINICAL DATA:  Chest pain for 1 month. EXAM: CHEST - 2 VIEW COMPARISON:  10/29/2018 FINDINGS: The heart size and mediastinal contours are within normal limits. Both lungs are clear. The visualized skeletal structures are unremarkable. IMPRESSION: No active cardiopulmonary disease. Electronically Signed   By: Marlaine Hind M.D.   On: 07/21/2019 19:25    Procedures Procedures (including critical care time)  Medications Ordered in ED Medications  ondansetron (ZOFRAN-ODT)  disintegrating tablet 4 mg (4 mg Oral Given 07/21/19 2214)  LORazepam (ATIVAN) tablet 0.5 mg (0.5 mg Oral Given 07/21/19 2243)     Initial Impression / Assessment and Plan / ED Course  I have reviewed the triage vital signs and the nursing notes.  Pertinent labs & imaging results that were available during my care of the patient were reviewed by me and considered in my medical decision making (see chart for details).  Clinical Course as of Jul 20 2245  Tue Jul 21, 2019  2209 While in the ED, pt had an episode of feeling anxious.  Resolved after nurse spoke to pt and calmed her .   [JK]    Clinical Course User Index [JK] Linwood Dibbles, MD    HEAR Score: 3Patient presented to ED for evaluation of chest pain.  Patient did have an episode of anxiety associated with the onset of the symptoms.  ED work-up is reassuring.  Low risk heart score and negative serial troponins.  I doubt acute coronary syndrome.  Patient denies any shortness of breath.  No PE risk factors.  I doubt pulmonary embolism.  Patient has had increased stress recently.  Anxiety may be a component of her symptoms.  While she was here she did have an episode of feeling very anxious.  Patient was given a dose of Ativan.  I will try her on a course of buspirone for anxiety.  I will give her outpatient referrals to primary care doctor and mental health providers.  Final Clinical Impressions(s) / ED Diagnoses   Final diagnoses:  Chest pain, unspecified type  Anxiety    ED Discharge Orders    None       Linwood Dibbles, MD 07/21/19 2246

## 2019-07-21 NOTE — ED Notes (Signed)
Pt called RN into room- states "I feel my heart is racing"- pt had taken herself off of cardiac monitor. Attempted to place pt back on monitor and pt requested to use bathroom. HR 125- this RN informed pt she needed to be on the heart monitor to see her rhythm. Pt refused and walked out of room to bathroom. RN standing by.

## 2019-07-21 NOTE — ED Notes (Signed)
Pt reports feeling very anxious because she began thinking about her son. This RN and sister at bedside talked with pt and provided comfort measures. Pt placed back on monitor and appears to be in NAD. Pt calm at this time. Requesting something for nausea. EDP informed.

## 2019-07-21 NOTE — ED Triage Notes (Signed)
Chest tightness x 1 month, also states she feels off balance.

## 2021-04-19 ENCOUNTER — Ambulatory Visit (INDEPENDENT_AMBULATORY_CARE_PROVIDER_SITE_OTHER): Payer: 59 | Admitting: Obstetrics & Gynecology

## 2021-04-19 ENCOUNTER — Other Ambulatory Visit: Payer: Self-pay

## 2021-04-19 ENCOUNTER — Encounter: Payer: Self-pay | Admitting: Obstetrics & Gynecology

## 2021-04-19 ENCOUNTER — Other Ambulatory Visit (HOSPITAL_COMMUNITY)
Admission: RE | Admit: 2021-04-19 | Discharge: 2021-04-19 | Disposition: A | Discharge status: Home / Self Care | Payer: 59 | Source: Ambulatory Visit | Attending: Obstetrics & Gynecology | Admitting: Obstetrics & Gynecology

## 2021-04-19 VITALS — BP 139/78 | HR 67 | Ht 68.0 in | Wt 154.0 lb

## 2021-04-19 DIAGNOSIS — Z01419 Encounter for gynecological examination (general) (routine) without abnormal findings: Secondary | ICD-10-CM | POA: Diagnosis not present

## 2021-04-19 DIAGNOSIS — Z139 Encounter for screening, unspecified: Secondary | ICD-10-CM | POA: Diagnosis not present

## 2021-04-19 DIAGNOSIS — E7800 Pure hypercholesterolemia, unspecified: Secondary | ICD-10-CM

## 2021-04-19 DIAGNOSIS — R1310 Dysphagia, unspecified: Secondary | ICD-10-CM

## 2021-04-19 DIAGNOSIS — N819 Female genital prolapse, unspecified: Secondary | ICD-10-CM

## 2021-04-19 MED ORDER — ALPRAZOLAM 0.25 MG PO TABS: ORAL_TABLET | ORAL | 0 refills | Status: DC

## 2021-04-19 NOTE — Progress Notes (Signed)
Last mam- 5 years ago

## 2021-04-19 NOTE — Progress Notes (Signed)
Subjective:     Patricia Spencer is a 65 y.o. female here for a routine exam.  Current complaints: difficulty swallowing and feels like bladder is coming out with activity and lifting.  Patient also having bouts of anxiety and panic attacks.  She becomes flushed and tachycardic and cannot calm her mind.  She has taken Xanax in the past which helps.  She takes it very rarely when the panic attack occurs.  Patient does not have a PCP at this time.   Gynecologic History No LMP recorded. Patient is postmenopausal. Contraception: post menopausal status Last Pap: 5 years ago in [place]. Results were: normal per patient Last mammogram:  5 years ago in [place]; results nml per patient.   Obstetric History OB History  Gravida Para Term Preterm AB Living  6 6 6     5   SAB IAB Ectopic Multiple Live Births               # Outcome Date GA Lbr Len/2nd Weight Sex Delivery Anes PTL Lv  6 Term           5 Term           4 Term           3 Term           2 Term           1 Term              The following portions of the patient's history were reviewed and updated as appropriate: allergies, current medications, past family history, past medical history, past social history, past surgical history, and problem list.  Review of Systems Pertinent items noted in HPI and remainder of comprehensive ROS otherwise negative.    Objective:    Tanner V Vulva:  No lesion Vagina:  Pink, no lesions, no discharge, no blood Cervix:  No CMT Uterus:  Non tender, mobile Right adnexa--non tender, no mass Left adnexa--non tender, no mass     Assessment:    Healthy female exam.    Plan:   Pap with co testing Mammogram Referral to dr. for PCP Dexa, calcium, vitamin D Referral to Dr. Lyn Hollingshead for difficulty swallowing and for colonoscopy Referral to urogyn for POP. Routine labs ordered for a fasting draw this week.  Pt having panic attacks and requesting refill on xanax.  Pt given 1 Rx and will be able to  get full evaluation for anxiety from PCP once she establishes care.

## 2021-04-21 ENCOUNTER — Encounter: Payer: Self-pay | Admitting: Obstetrics & Gynecology

## 2021-04-21 ENCOUNTER — Telehealth: Payer: Self-pay

## 2021-04-21 DIAGNOSIS — R7989 Other specified abnormal findings of blood chemistry: Secondary | ICD-10-CM

## 2021-04-21 DIAGNOSIS — E7800 Pure hypercholesterolemia, unspecified: Secondary | ICD-10-CM | POA: Insufficient documentation

## 2021-04-21 LAB — HEMOGLOBIN A1C
Hgb A1c MFr Bld: 5.4 % of total Hgb (ref <5.7)
Mean Plasma Glucose: 108 mg/dL
eAG (mmol/L): 6 mmol/L

## 2021-04-21 LAB — COMPREHENSIVE METABOLIC PANEL
AG Ratio: 1.7 (calc) (ref 1.0–2.5)
ALT: 13 U/L (ref 6–29)
AST: 14 U/L (ref 10–35)
Albumin: 4.3 g/dL (ref 3.6–5.1)
Alkaline phosphatase (APISO): 98 U/L (ref 37–153)
BUN: 9 mg/dL (ref 7–25)
CO2: 27 mmol/L (ref 20–32)
Calcium: 8.7 mg/dL (ref 8.6–10.4)
Chloride: 107 mmol/L (ref 98–110)
Creat: 0.87 mg/dL (ref 0.50–0.99)
Globulin: 2.5 g/dL (calc) (ref 1.9–3.7)
Glucose, Bld: 88 mg/dL (ref 65–99)
Potassium: 4.4 mmol/L (ref 3.5–5.3)
Sodium: 142 mmol/L (ref 135–146)
Total Bilirubin: 0.7 mg/dL (ref 0.2–1.2)
Total Protein: 6.8 g/dL (ref 6.1–8.1)

## 2021-04-21 LAB — CBC
HCT: 41 % (ref 35.0–45.0)
Hemoglobin: 14.2 g/dL (ref 11.7–15.5)
MCH: 30.3 pg (ref 27.0–33.0)
MCHC: 34.6 g/dL (ref 32.0–36.0)
MCV: 87.6 fL (ref 80.0–100.0)
MPV: 11.4 fL (ref 7.5–12.5)
Platelets: 231 10*3/uL (ref 140–400)
RBC: 4.68 10*6/uL (ref 3.80–5.10)
RDW: 12.6 % (ref 11.0–15.0)
WBC: 4.8 10*3/uL (ref 3.8–10.8)

## 2021-04-21 LAB — LIPID PANEL
Cholesterol: 304 mg/dL — ABNORMAL HIGH (ref <200)
HDL: 46 mg/dL — ABNORMAL LOW (ref >=50)
LDL Cholesterol (Calc): 230 mg/dL (calc) — ABNORMAL HIGH
Non-HDL Cholesterol (Calc): 258 mg/dL (calc) — ABNORMAL HIGH (ref <130)
Total CHOL/HDL Ratio: 6.6 (calc) — ABNORMAL HIGH (ref <5.0)
Triglycerides: 131 mg/dL (ref <150)

## 2021-04-21 LAB — TSH: TSH: 2.65 mIU/L (ref 0.40–4.50)

## 2021-04-21 LAB — VITAMIN D 25 HYDROXY (VIT D DEFICIENCY, FRACTURES): Vit D, 25-Hydroxy: 19 ng/mL — ABNORMAL LOW (ref 30–100)

## 2021-04-21 MED ORDER — VITAMIN D (ERGOCALCIFEROL) 1.25 MG (50000 UNIT) PO CAPS
50000.0000 [IU] | ORAL_CAPSULE | ORAL | 0 refills | Status: DC
Start: 2021-04-21 — End: 2022-01-05

## 2021-04-21 NOTE — Telephone Encounter (Addendum)
Spoke with pt and she is aware of low Vitamin D and that Rx has been sent. Pt is aware that referral was sent to PCP and they should be reaching out to her. Pt expressed understanding.   ----- Message from [name], MD sent at 04/21/2021 11:33 AM EDT ----- Pt needs Vit D 50K prescribed and follow up on referral to PCP for hypercholesterolemia. Pt needs my chart activated as well.  Please call patient.

## 2021-04-24 LAB — CYTOLOGY - PAP
Comment: NEGATIVE
Diagnosis: NEGATIVE
High risk HPV: NEGATIVE

## 2021-04-26 ENCOUNTER — Telehealth: Payer: Self-pay | Admitting: *Deleted

## 2021-04-26 NOTE — Telephone Encounter (Signed)
-----   Message from Patricia Dukes, MD sent at 04/24/2021  4:40 PM EDT ----- Pt's pap smear is normal.  Please mail results.  She does not have my hcart.

## 2021-04-26 NOTE — Telephone Encounter (Signed)
Copy of pap smear mailed to pt's home address along with notification that her Vitamin D level was low.  RX was sent to her pharmacy by Dr Penne Lash.

## 2021-04-26 NOTE — Telephone Encounter (Signed)
-----   Message from Kelly H Leggett, MD sent at 04/24/2021  4:40 PM EDT ----- Pt's pap smear is normal.  Please mail results.  She does not have my hcart.  

## 2021-04-27 ENCOUNTER — Telehealth: Payer: Self-pay | Admitting: *Deleted

## 2021-04-27 NOTE — Telephone Encounter (Signed)
-----   Message from [name], MD sent at 04/27/2021  3:05 PM EDT ----- Regarding: RE: New Patient Hi, I am waiting to hear back from [facility] about primary care female physicians taking patients.  Please let patient know I haven't forgotten! ----- Message ----- From: Acquanetta Sit, NT Sent: 04/25/2021   3:35 PM EDT To: [name], MD Subject: New Patient                                    Good Afternoon! This patient called Dr. Mardelle Matte office but she is not accepting new patients at this time. Patient wants a female MD only and none are available. Patient would like for you to either reach out to Dr. Lyn Hollingshead to see if she will accept her as a new patient or recommend another PCP within [facility]

## 2021-04-27 NOTE — Telephone Encounter (Signed)
Spoke with patient to give her the message from Dr. Penne Lash.

## 2021-05-05 ENCOUNTER — Telehealth: Payer: Self-pay

## 2021-05-05 NOTE — Telephone Encounter (Signed)
Patricia Spencer is a 65 y.o. female was called and contacted re: New pt Pre appt call to collect history information. Pt verified using 2 identifiers. Confirmation that I am speaking with the correct person.  Chart was updated: -Allergy -Medication -Confirm pharmacy -OB history  Pt was notified to arrive 15 min early and we will need a urine sample when she arrives. Pt verbalized understanding.

## 2021-05-08 ENCOUNTER — Ambulatory Visit (INDEPENDENT_AMBULATORY_CARE_PROVIDER_SITE_OTHER): Payer: 59 | Admitting: Obstetrics and Gynecology

## 2021-05-08 ENCOUNTER — Encounter: Payer: Self-pay | Admitting: Obstetrics and Gynecology

## 2021-05-08 ENCOUNTER — Other Ambulatory Visit: Payer: Self-pay

## 2021-05-08 VITALS — BP 133/85 | HR 85 | Ht 67.0 in | Wt 155.0 lb

## 2021-05-08 DIAGNOSIS — N812 Incomplete uterovaginal prolapse: Secondary | ICD-10-CM

## 2021-05-08 DIAGNOSIS — N816 Rectocele: Secondary | ICD-10-CM

## 2021-05-08 DIAGNOSIS — R350 Frequency of micturition: Secondary | ICD-10-CM

## 2021-05-08 DIAGNOSIS — N8110 Cystocele, unspecified: Secondary | ICD-10-CM

## 2021-05-08 LAB — POCT URINALYSIS DIPSTICK
Appearance: NORMAL
Bilirubin, UA: NEGATIVE
Blood, UA: NEGATIVE
Glucose, UA: NEGATIVE
Ketones, UA: NEGATIVE
Leukocytes, UA: NEGATIVE
Nitrite, UA: NEGATIVE
Protein, UA: NEGATIVE
Spec Grav, UA: >=1.03 — AB (ref 1.010–1.025)
Urobilinogen, UA: 0.2 E.U./dL
pH, UA: 6 (ref 5.0–8.0)

## 2021-05-08 NOTE — Progress Notes (Signed)
[name]  Referring Provider: [name], MD PCP: [name], No Date of Service: 05/08/2021  SUBJECTIVE Chief Complaint: New Patient (Initial Visit) Patricia Spencer is a 65 y.o. female for a consult on prolapse.)  History of Present Illness: Patricia Spencer is a 65 y.o. Black or African-American female seen in consultation at the request of Dr. Penne Lash for evaluation of prolapse.    Review of records from Dr Penne Lash significant for: Feels like bladder is coming out with activity and lifting.   Urinary Symptoms: Leaks urine with with movement to the bathroom Leaks every once in a while if she holds it too long.  Does not wear pads.  She is not bothered by her UI symptoms.  Day time voids- every few hours.  Nocturia: 1 times per night to void. Voiding dysfunction: she empties her bladder well.  does not use a catheter to empty bladder.  When urinating, she feels she has no difficulties Drinks: pepsi, not a lot of water.   UTIs:  0  UTI's in the last year.   Denies history of blood in urine and kidney or bladder stones  Pelvic Organ Prolapse Symptoms:                  She Admits to a feeling of a bulge the vaginal area. It has been present for several years.  She Admits to seeing a bulge.  This bulge is bothersome.  Bowel Symptom: Bowel movements: 1 time(s) per day Stool consistency: hard, soft , or loose Straining: yes, sometimes Splinting: yes.  Incomplete evacuation: yes, sometimes She Denies accidental bowel leakage / fecal incontinence Bowel regimen: none  Sexual Function Sexually active: no.   Pelvic Pain Denies pelvic pain  Past Medical History:  Past Medical History:  Diagnosis Date   Bronchitis    High cholesterol      Past Surgical History:  History reviewed. No pertinent surgical history.   Past OB/GYN History: Menopausal: Yes, Denies vaginal bleeding since menopause Any history of abnormal  pap smears: no. OB History  Gravida Para Term Preterm AB Living  6 6 6     5   SAB IAB Ectopic Multiple Live Births               # Outcome Date GA Lbr Len/2nd Weight Sex Delivery Anes PTL Lv  6 Term           5 Term           4 Term           3 Term           2 Term           1 Term              Medications: She has a current medication list which includes the following prescription(s): alprazolam and vitamin d (ergocalciferol).   Allergies: Patient has No Known Allergies.   Social History:  Social History   Tobacco Use   Smoking status: Never   Smokeless tobacco: Never  Vaping Use   Vaping Use: Never used  Substance Use Topics   Alcohol use: Not Currently   Drug use: Never    Relationship status: widowed She lives with son and grandson.   She is not employed. Regular exercise: No History of abuse: No  Family History:   Family History  Problem Relation Age of Onset   Colon cancer Father    Hypertension  Father    Heart disease Mother      Review of Systems: Review of Systems  Constitutional:  Positive for malaise/fatigue. Negative for fever and weight loss.  Respiratory:  Negative for cough, shortness of breath and wheezing.   Cardiovascular:  Negative for chest pain, palpitations and leg swelling.  Gastrointestinal:  Negative for abdominal pain and blood in stool.  Genitourinary:  Negative for dysuria.  Musculoskeletal:  Negative for myalgias.  Skin:  Negative for rash.  Neurological:  Negative for dizziness and headaches.  Endo/Heme/Allergies:  Does not bruise/bleed easily.       + hot flashes  Psychiatric/Behavioral:  Negative for depression. The patient is not nervous/anxious.     OBJECTIVE Physical Exam: Vitals:   05/08/21 1003  BP: 133/85  Pulse: 85  Weight: 155 lb (70.3 kg)  Height: 5\' 7"  (1.702 m)    Physical Exam Constitutional:      General: She is not in acute distress. Pulmonary:     Effort: Pulmonary effort is normal.   Abdominal:     General: There is no distension.     Palpations: Abdomen is soft.     Tenderness: There is no abdominal tenderness. There is no rebound.  Musculoskeletal:        General: No swelling. Normal range of motion.  Skin:    General: Skin is warm and dry.     Findings: No rash.  Neurological:     Mental Status: She is alert and oriented to person, place, and time.  Psychiatric:        Mood and Affect: Mood normal.        Behavior: Behavior normal.     GU / Detailed Urogynecologic Evaluation:  Pelvic Exam: Normal external female genitalia; Bartholin's and Skene's glands normal in appearance; urethral meatus normal in appearance, no urethral masses or discharge.   CST: negative  Speculum exam reveals normal vaginal mucosa without atrophy. Cervix normal appearance. Uterus normal single, nontender. Adnexa normal adnexa.      Pelvic floor strength III/V, puborectalis IV/V external anal sphincter IV/V  Pelvic floor musculature: Right levator non-tender, Right obturator non-tender, Left levator non-tender, Left obturator non-tender  POP-Q:   POP-Q  0                                            Aa   0                                           Ba  -7                                              C   3.5                                            Gh  2.5  Pb  10                                            tvl   -1                                            Ap  -1                                            Bp  -9                                              D     Rectal Exam:  Normal sphincter tone, small distal rectocele, enterocoele not present, no rectal masses  Post-Void Residual (PVR) by Bladder Scan: In order to evaluate bladder emptying, we discussed obtaining a postvoid residual and she agreed to this procedure.  Procedure: The ultrasound unit was placed on the patient's abdomen in the suprapubic region after  the patient had voided. A PVR of 40 ml was obtained by bladder scan.  Laboratory Results: POC urine: negative   ASSESSMENT AND PLAN [name] is a 65 y.o. with:  1. Uterovaginal prolapse, incomplete   2. Prolapse of anterior vaginal wall   3. Prolapse of posterior vaginal wall   4. Urinary frequency    Stage II anterior, Stage I posterior, Stage I apical prolapse - For treatment of pelvic organ prolapse, we discussed options for management including expectant management, conservative management, and surgical management, such as Kegels, a pessary, pelvic floor physical therapy, and specific surgical procedures. - Handouts provided on surgical options.  - She would like to return for a pessary fitting  2. Urinary frequency - Discussed avoidance of bladder irritants and drinking more water. She will reduce pepsi intake.  - POC urine negative today  Return for pessary fitting  Marguerita Beards, MD   Medical Decision Making:  - Reviewed/ ordered a clinical laboratory test - Review and summation of prior records

## 2021-05-08 NOTE — Patient Instructions (Addendum)
Today we talked about ways to manage bladder urgency such as altering your diet to avoid irritative beverages and foods (bladder diet) as well as attempting to decrease stress and other exacerbating factors.    The Most Bothersome Foods* The Least Bothersome Foods*  Coffee - Regular & Decaf Tea - caffeinated Carbonated beverages - cola, non-colas, diet & caffeine-free Alcohols - Beer, Red Wine, White Wine, Champagne Fruits - Grapefruit, Lemon, Orange, Pineapple Fruit Juices - Cranberry, Grapefruit, Orange, Pineapple Vegetables - Tomato & Tomato Products Flavor Enhancers - Hot peppers, Spicy foods, Chili, Horseradish, Vinegar, Monosodium glutamate (MSG) Artificial Sweeteners - NutraSweet, Sweet 'N Low, Equal (sweetener), Saccharin Ethnic foods - Mexican, Thai, Indian food Water Milk - low-fat & whole Fruits - Bananas, Blueberries, Honeydew melon, Pears, Raisins, Watermelon Vegetables - Broccoli, Brussels Sprouts, Cabbage, Carrots, Cauliflower, Celery, Cucumber, Mushrooms, Peas, Radishes, Squash, Zucchini, White potatoes, Sweet potatoes & yams Poultry - Chicken, Eggs, Turkey, Meat - Beef, Pork, Lamb Seafood - Shrimp, Tuna fish, Salmon Grains - Oat, Rice Snacks - Pretzels, Popcorn  *Friedlander J. et al. Diet and its role in interstitial cystitis/bladder pain syndrome (IC/BPS) and comorbid conditions. [employer]. BJU Int. 2012 Jan 11.     You have a stage 2 (out of 4) prolapse.  We discussed the fact that it is not life threatening but there are several treatment options. For treatment of pelvic organ prolapse, we discussed options for management including expectant management, conservative management, and surgical management, such as Kegels, a pessary, pelvic floor physical therapy, and specific surgical procedures.    

## 2021-05-10 ENCOUNTER — Ambulatory Visit (INDEPENDENT_AMBULATORY_CARE_PROVIDER_SITE_OTHER): Payer: 59

## 2021-05-10 ENCOUNTER — Other Ambulatory Visit: Payer: Self-pay

## 2021-05-10 DIAGNOSIS — Z780 Asymptomatic menopausal state: Secondary | ICD-10-CM | POA: Diagnosis not present

## 2021-05-10 DIAGNOSIS — Z139 Encounter for screening, unspecified: Secondary | ICD-10-CM | POA: Diagnosis not present

## 2021-05-10 DIAGNOSIS — Z01419 Encounter for gynecological examination (general) (routine) without abnormal findings: Secondary | ICD-10-CM | POA: Diagnosis not present

## 2021-05-10 DIAGNOSIS — Z1231 Encounter for screening mammogram for malignant neoplasm of breast: Secondary | ICD-10-CM

## 2021-05-12 ENCOUNTER — Telehealth: Payer: Self-pay

## 2021-05-12 NOTE — Telephone Encounter (Addendum)
Spoke with pt she is aware of normal BMD and recommendations of exercise, vit D and Ca. Pt expressed understanding.   ----- Message from Willodean Rosenthal, MD sent at 05/12/2021 11:01 AM EDT ----- Please call pt. Her BMD was WNL.   Rec weight bearing exercise and vit D and Ca for cont'd protection.   Thx,  Clh-S

## 2021-05-12 NOTE — Telephone Encounter (Signed)
error 

## 2021-05-24 ENCOUNTER — Ambulatory Visit: Payer: 59 | Admitting: Family Medicine

## 2021-06-15 NOTE — Progress Notes (Signed)
[facility]   Subjective:     Chief Complaint: Pessary fitting  History of Present Illness: Patricia Spencer is a 65 y.o. female with stage II pelvic organ prolapse who presents today for a pessary fitting.    Past Medical History: Patient  has a past medical history of Bronchitis and High cholesterol.   Past Surgical History: She  has no past surgical history on file.   Medications: She has a current medication list which includes the following prescription(s): alprazolam and vitamin d (ergocalciferol).   Allergies: Patient has No Known Allergies.   Social History: Patient  reports that she has never smoked. She has never used smokeless tobacco. She reports that she does not currently use alcohol. She reports that she does not use drugs.      Objective:    BP 132/87   Pulse 79  Gen: No apparent distress, A&O x 3. Pelvic Exam: Normal external female genitalia; Bartholin's and Skene's glands normal in appearance; urethral meatus normal in appearance, no urethral masses or discharge.   A size #4 ring with support pessary was fitted. It was comfortable, stayed in place with valsalva, standing and bending and was an appropriate size on examination, with one finger fitting between the pessary and the vaginal walls.   POP-Q (05/08/21):    POP-Q   0                                            Aa   0                                           Ba   -7                                              C    3.5                                            Gh   2.5                                            Pb   10                                            tvl    -1                                            Ap   -1  Bp   -9                                              D      Assessment/Plan:    Assessment: Patricia Spencer is a 65 y.o. with stage II pelvic organ prolapse who presents for a pessary fitting. Plan: She was  fitted with a #4 ring with support pessary. She will keep the pessary in place until next visit.   Follow-up in 2 weeks for a pessary check or sooner as needed.  All questions were answered.    Marguerita Beards, MD

## 2021-06-20 ENCOUNTER — Ambulatory Visit (INDEPENDENT_AMBULATORY_CARE_PROVIDER_SITE_OTHER): Payer: 59 | Admitting: Obstetrics and Gynecology

## 2021-06-20 ENCOUNTER — Other Ambulatory Visit: Payer: Self-pay

## 2021-06-20 ENCOUNTER — Encounter: Payer: Self-pay | Admitting: Obstetrics and Gynecology

## 2021-06-20 VITALS — BP 132/87 | HR 79

## 2021-06-20 DIAGNOSIS — N812 Incomplete uterovaginal prolapse: Secondary | ICD-10-CM | POA: Diagnosis not present

## 2021-06-20 DIAGNOSIS — N816 Rectocele: Secondary | ICD-10-CM

## 2021-06-20 DIAGNOSIS — N8110 Cystocele, unspecified: Secondary | ICD-10-CM | POA: Diagnosis not present

## 2021-06-23 ENCOUNTER — Ambulatory Visit: Payer: 59 | Admitting: Obstetrics and Gynecology

## 2021-07-04 ENCOUNTER — Ambulatory Visit (INDEPENDENT_AMBULATORY_CARE_PROVIDER_SITE_OTHER): Payer: 59 | Admitting: Family Medicine

## 2021-07-04 ENCOUNTER — Other Ambulatory Visit: Payer: Self-pay

## 2021-07-04 ENCOUNTER — Encounter: Payer: Self-pay | Admitting: Family Medicine

## 2021-07-04 VITALS — BP 143/75 | HR 82 | Ht 68.0 in | Wt 155.0 lb

## 2021-07-04 DIAGNOSIS — E7800 Pure hypercholesterolemia, unspecified: Secondary | ICD-10-CM

## 2021-07-04 DIAGNOSIS — Z23 Encounter for immunization: Secondary | ICD-10-CM | POA: Diagnosis not present

## 2021-07-04 DIAGNOSIS — Z1211 Encounter for screening for malignant neoplasm of colon: Secondary | ICD-10-CM | POA: Diagnosis not present

## 2021-07-04 DIAGNOSIS — R7989 Other specified abnormal findings of blood chemistry: Secondary | ICD-10-CM

## 2021-07-04 DIAGNOSIS — R1312 Dysphagia, oropharyngeal phase: Secondary | ICD-10-CM

## 2021-07-04 LAB — LIPID PANEL
Cholesterol: 304 mg/dL — ABNORMAL HIGH
HDL: 49 mg/dL — ABNORMAL LOW
LDL Cholesterol (Calc): 230 mg/dL — ABNORMAL HIGH
Non-HDL Cholesterol (Calc): 255 mg/dL — ABNORMAL HIGH
Total CHOL/HDL Ratio: 6.2 (calc) — ABNORMAL HIGH
Triglycerides: 117 mg/dL

## 2021-07-04 LAB — VITAMIN D 25 HYDROXY (VIT D DEFICIENCY, FRACTURES): Vit D, 25-Hydroxy: 35 ng/mL (ref 30–100)

## 2021-07-04 MED ORDER — PANTOPRAZOLE SODIUM 40 MG PO TBEC
40.0000 mg | DELAYED_RELEASE_TABLET | Freq: Every day | ORAL | 0 refills | Status: DC
Start: 2021-07-04 — End: 2022-01-05

## 2021-07-04 NOTE — Patient Instructions (Signed)
Nice to meet you today! Your cholesterol was very high on previous labs.  We are rechecking this today.  I am also rechecking your vitamin d levels We'll be in touch with lab results.   Let's see if pantoprazole helps with sensation of food getting stuck.  Try triamcinolone as needed on rash.

## 2021-07-04 NOTE — Assessment & Plan Note (Signed)
Update vitamin D levels. 

## 2021-07-04 NOTE — Assessment & Plan Note (Signed)
Rechecking lipid panel she was not fasting during previous labs.  If this remains elevated we discussed making dietary changes as well as considering starting medication to lower cholesterol.

## 2021-07-04 NOTE — Assessment & Plan Note (Signed)
Starting trial of PPI to see if she has improvement of her symptoms with this.  Referral placed to GI for colonoscopy as well as consideration of EGD if she is continue to have dysphagia.

## 2021-07-04 NOTE — Progress Notes (Signed)
Patricia Spencer - 65 y.o. female MRN [id number]  Date of birth: 09-21-1956  Subjective Chief Complaint  Patient presents with   Establish Care    HPI Patricia Spencer is a 65 year old female here today for initial visit to establish care.  She has history of hyperlipidemia.  She did have labs at her well woman exam a couple of months ago showed that her cholesterol is significantly elevated.  She would like to avoid medication if possible.  She does admit to poor diet which could be contributing to this.  Vitamin D levels are also low on recent labs.  She has been taking 50,000 international units of vitamin D weekly.  Other concern today is sensation of food getting stuck when trying to swallow.  No issues with liquids.  She does admit to some symptoms consistent with reflux.  She is due for colon cancer screening as well as there is a family history of colon cancer.  ROS:  A comprehensive ROS was completed and negative except as noted per HPI  No Known Allergies  Past Medical History:  Diagnosis Date   Bronchitis    High cholesterol     History reviewed. No pertinent surgical history.  Social History   Socioeconomic History   Marital status: Widowed    Spouse name: Not on file   Number of children: Not on file   Years of education: Not on file   Highest education level: Not on file  Occupational History   Not on file  Tobacco Use   Smoking status: Never   Smokeless tobacco: Never  Vaping Use   Vaping Use: Never used  Substance and Sexual Activity   Alcohol use: Not Currently   Drug use: Never   Sexual activity: Not Currently    Birth control/protection: Post-menopausal  Other Topics Concern   Not on file  Social History Narrative   Not on file   Social Determinants of Health   Financial Resource Strain: Not on file  Food Insecurity: Not on file  Transportation Needs: Not on file  Physical Activity: Not on file  Stress: Not on file  Social Connections: Not on  file    Family History  Problem Relation Age of Onset   Colon cancer Father    Hypertension Father    Heart disease Mother     Health Maintenance  Topic Date Due   COLONOSCOPY (Pts 45-52yrs Insurance coverage will need to be confirmed)  Never done   Zoster Vaccines- Shingrix (1 of 2) 10/03/2021 (Originally 09/23/2008)   INFLUENZA VACCINE  01/12/2022 (Originally 05/15/2021)   TETANUS/TDAP  07/04/2022 (Originally 09/23/1977)   Hepatitis C Screening  07/04/2022 (Originally 09/23/1976)   HIV Screening  07/04/2022 (Originally 09/23/1973)   MAMMOGRAM  05/11/2023   PAP SMEAR-Modifier  04/19/2024   HPV VACCINES  Aged Out     ----------------------------------------------------------------------------------------------------------------------------------------------------------------------------------------------------------------- Physical Exam BP (!) 143/75   Pulse 82   Ht 5\' 8"  (1.727 m)   Wt 155 lb (70.3 kg)   SpO2 99% Comment: on RA  BMI 23.57 kg/m   Physical Exam Constitutional:      Appearance: Normal appearance.  HENT:     Head: Normocephalic and atraumatic.     Mouth/Throat:     Mouth: Mucous membranes are moist.     Pharynx: Oropharynx is clear.  Eyes:     General: No scleral icterus. Cardiovascular:     Rate and Rhythm: Normal rate and regular rhythm.  Pulmonary:     Effort: Pulmonary effort is  normal.     Breath sounds: Normal breath sounds.  Abdominal:     General: There is no distension.     Tenderness: There is no abdominal tenderness.  Musculoskeletal:     Cervical back: Neck supple.  Neurological:     General: No focal deficit present.     Mental Status: She is alert.  Psychiatric:        Mood and Affect: Mood normal.        Behavior: Behavior normal.     ------------------------------------------------------------------------------------------------------------------------------------------------------------------------------------------------------------------- Assessment and Plan  Hypercholesterolemia Rechecking lipid panel she was not fasting during previous labs.  If this remains elevated we discussed making dietary changes as well as considering starting medication to lower cholesterol.  Low vitamin D level Update vitamin D levels.  Oropharyngeal dysphagia Starting trial of PPI to see if she has improvement of her symptoms with this.  Referral placed to GI for colonoscopy as well as consideration of EGD if she is continue to have dysphagia.   Meds ordered this encounter  Medications   pantoprazole (PROTONIX) 40 MG tablet    Sig: Take 1 tablet (40 mg total) by mouth daily.    Dispense:  30 tablet    Refill:  0    No follow-ups on file.    This visit occurred during the SARS-CoV-2 public health emergency.  Safety protocols were in place, including screening questions prior to the visit, additional usage of staff PPE, and extensive cleaning of exam room while observing appropriate contact time as indicated for disinfecting solutions.

## 2021-07-14 ENCOUNTER — Other Ambulatory Visit: Payer: Self-pay | Admitting: Family Medicine

## 2021-07-14 DIAGNOSIS — E7800 Pure hypercholesterolemia, unspecified: Secondary | ICD-10-CM

## 2021-07-14 MED ORDER — ROSUVASTATIN CALCIUM 40 MG PO TABS: 40.0000 mg | ORAL_TABLET | Freq: Every day | ORAL | 3 refills | Status: DC

## 2021-07-14 MED ORDER — MELOXICAM 15 MG PO TABS: 15.0000 mg | ORAL_TABLET | Freq: Every day | ORAL | 0 refills | Status: DC

## 2021-07-20 NOTE — Progress Notes (Signed)
[facility]   Subjective:     Chief Complaint:  Chief Complaint  Patient presents with   Pessary Check   History of Present Illness: Patricia Spencer is a 65 y.o. female with stage II pelvic organ prolapse who presents for a pessary check. She is using a size #4 ring with support pessary. She was straining to have a BM and the pessary fell out.   Past Medical History: Patient  has a past medical history of Bronchitis and High cholesterol.   Past Surgical History: She  has no past surgical history on file.   Medications: She has a current medication list which includes the following prescription(s): alprazolam, meloxicam, pantoprazole, rosuvastatin, and vitamin d (ergocalciferol).   Allergies: Patient has No Known Allergies.   Social History: Patient  reports that she has never smoked. She has never used smokeless tobacco. She reports that she does not currently use alcohol. She reports that she does not use drugs.      Objective:    Physical Exam: BP (!) 148/90   Pulse 89  Gen: No apparent distress, A&O x 3. Detailed Urogynecologic Evaluation:  Pelvic Exam: Normal external female genitalia; Bartholin's and Skene's glands normal in appearance; urethral meatus normal in appearance, no urethral masses or discharge.   A #3 ring with support pessary was placed. It was comfortable, fit well, and stayed in placed with strong cough, valsalva and bending. The patient demonstrated proper placement and removal with a string tied to the pessary.   POP-Q (05/08/21):    POP-Q   0                                            Aa   0                                           Ba   -7                                              C    3.5                                            Gh   2.5                                            Pb   10                                            tvl    -1                                            Ap   -  1                                             Bp   -9                                              D      Assessment/Plan:    Assessment: Patricia Spencer is a 65 y.o. with stage II pelvic organ prolapse here for a pessary check.   Plan: She will keep the pessary in place until next visit.  She will follow-up in 2-3 weeks for a pessary check or sooner as needed.  All questions were answered.  Marguerita Beards, MD

## 2021-07-21 ENCOUNTER — Ambulatory Visit (INDEPENDENT_AMBULATORY_CARE_PROVIDER_SITE_OTHER): Payer: 59 | Admitting: Obstetrics and Gynecology

## 2021-07-21 ENCOUNTER — Encounter: Payer: Self-pay | Admitting: Obstetrics and Gynecology

## 2021-07-21 ENCOUNTER — Other Ambulatory Visit: Payer: Self-pay

## 2021-07-21 VITALS — BP 148/90 | HR 89

## 2021-07-21 DIAGNOSIS — N812 Incomplete uterovaginal prolapse: Secondary | ICD-10-CM

## 2021-07-21 DIAGNOSIS — N816 Rectocele: Secondary | ICD-10-CM

## 2021-07-21 DIAGNOSIS — N8110 Cystocele, unspecified: Secondary | ICD-10-CM

## 2021-08-10 ENCOUNTER — Other Ambulatory Visit: Payer: Self-pay

## 2021-08-10 ENCOUNTER — Encounter: Payer: Self-pay | Admitting: Obstetrics and Gynecology

## 2021-08-10 ENCOUNTER — Ambulatory Visit (INDEPENDENT_AMBULATORY_CARE_PROVIDER_SITE_OTHER): Payer: 59 | Admitting: Obstetrics and Gynecology

## 2021-08-10 VITALS — BP 150/82 | HR 83 | Wt 155.0 lb

## 2021-08-10 DIAGNOSIS — N8110 Cystocele, unspecified: Secondary | ICD-10-CM | POA: Diagnosis not present

## 2021-08-10 NOTE — Progress Notes (Signed)
[facility]   Subjective:     Chief Complaint:  Chief Complaint  Patient presents with   Pessary Check   History of Present Illness: Patricia Spencer is a 65 y.o. female with stage II pelvic organ prolapse who presents for a pessary check. She is using a size #3 ring with support pessary. The pessary fell out after a day. She would like to try another one.   Past Medical History: Patient  has a past medical history of Bronchitis and High cholesterol.   Past Surgical History: She  has no past surgical history on file.   Medications: She has a current medication list which includes the following prescription(s): alprazolam, meloxicam, pantoprazole, rosuvastatin, and vitamin d (ergocalciferol).   Allergies: Patient has No Known Allergies.   Social History: Patient  reports that she has never smoked. She has never used smokeless tobacco. She reports that she does not currently use alcohol. She reports that she does not use drugs.      Objective:    Physical Exam: BP (!) 150/82   Pulse 83   Wt 155 lb (70.3 kg)   BMI 23.57 kg/m  Gen: No apparent distress, A&O x 3. Detailed Urogynecologic Evaluation:  Pelvic Exam: Normal external female genitalia; Bartholin's and Skene's glands normal in appearance; urethral meatus normal in appearance, no urethral masses or discharge.   Two gehrung pessaries were placed but they felt uncomfortable with sitting and standing so they were removed. A 3in shaatz pessary was placed. It was comfortable, fit well, and stayed in placed with strong cough, valsalva and bending.    Prior exam:    POP-Q (05/08/21):    POP-Q   0                                            Aa   0                                           Ba   -7                                              C    3.5                                            Gh   2.5                                            Pb   10                                            tvl     -1  Ap   -1                                            Bp   -9                                              D         Assessment/Plan:    Assessment: Patricia Spencer is a 65 y.o. with stage III pelvic organ prolapse   Plan: - A 3in shaatz pessary was placed today. She will leave in place.  - She is interested in trying the #4 ring with support pessary again (the first she tried)- so we will order this for her.  - Will have her return 2 weeks for pessary check  All questions were answered.  Marguerita Beards, MD

## 2021-08-11 ENCOUNTER — Ambulatory Visit: Payer: 59 | Admitting: Obstetrics and Gynecology

## 2021-08-22 NOTE — Progress Notes (Deleted)
[facility]   Subjective:     Chief Complaint:  No chief complaint on file.  History of Present Illness: Patricia Spencer is a 65 y.o. female with stage II pelvic organ prolapse who presents for a pessary check. She is using a size #3 ring with support pessary. The pessary fell out after a day. She would like to try another one.   Past Medical History: Patient  has a past medical history of Bronchitis and High cholesterol.   Past Surgical History: She  has no past surgical history on file.   Medications: She has a current medication list which includes the following prescription(s): alprazolam, meloxicam, pantoprazole, rosuvastatin, and vitamin d (ergocalciferol).   Allergies: Patient has No Known Allergies.   Social History: Patient  reports that she has never smoked. She has never used smokeless tobacco. She reports that she does not currently use alcohol. She reports that she does not use drugs.      Objective:    Physical Exam: There were no vitals taken for this visit. Gen: No apparent distress, A&O x 3. Detailed Urogynecologic Evaluation:  Pelvic Exam: Normal external female genitalia; Bartholin's and Skene's glands normal in appearance; urethral meatus normal in appearance, no urethral masses or discharge.   Two gehrung pessaries were placed but they felt uncomfortable with sitting and standing so they were removed. A 3in shaatz pessary was placed. It was comfortable, fit well, and stayed in placed with strong cough, valsalva and bending.    Prior exam:    POP-Q (05/08/21):    POP-Q   0                                            Aa   0                                           Ba   -7                                              C    3.5                                            Gh   2.5                                            Pb   10                                            tvl    -1  Ap    -1                                            Bp   -9                                              D         Assessment/Plan:    Assessment: Patricia Spencer is a 65 y.o. with stage III pelvic organ prolapse   Plan: - A 3in shaatz pessary was placed today. She will leave in place.  - She is interested in trying the #4 ring with support pessary again (the first she tried)- so we will order this for her.  - Will have her return 2 weeks for pessary check  All questions were answered.  Marguerita Beards, MD

## 2021-08-24 ENCOUNTER — Ambulatory Visit: Payer: 59 | Admitting: Obstetrics and Gynecology

## 2021-08-31 ENCOUNTER — Encounter: Payer: Self-pay | Admitting: Gastroenterology

## 2021-09-20 ENCOUNTER — Ambulatory Visit: Payer: 59 | Admitting: Gastroenterology

## 2021-09-29 ENCOUNTER — Ambulatory Visit: Payer: 59 | Admitting: Gastroenterology

## 2021-10-03 ENCOUNTER — Encounter: Payer: Self-pay | Admitting: Obstetrics & Gynecology

## 2021-10-05 ENCOUNTER — Other Ambulatory Visit: Payer: Self-pay | Admitting: Family Medicine

## 2021-10-19 ENCOUNTER — Ambulatory Visit: Payer: 59 | Admitting: Obstetrics and Gynecology

## 2021-11-01 ENCOUNTER — Other Ambulatory Visit: Payer: Self-pay | Admitting: Obstetrics & Gynecology

## 2021-11-01 DIAGNOSIS — R7989 Other specified abnormal findings of blood chemistry: Secondary | ICD-10-CM

## 2021-11-24 ENCOUNTER — Encounter: Payer: Self-pay | Admitting: Obstetrics and Gynecology

## 2021-11-24 ENCOUNTER — Ambulatory Visit (INDEPENDENT_AMBULATORY_CARE_PROVIDER_SITE_OTHER): Payer: 59 | Admitting: Obstetrics and Gynecology

## 2021-11-24 ENCOUNTER — Other Ambulatory Visit: Payer: Self-pay

## 2021-11-24 VITALS — BP 134/83 | HR 98

## 2021-11-24 DIAGNOSIS — N816 Rectocele: Secondary | ICD-10-CM

## 2021-11-24 DIAGNOSIS — N8110 Cystocele, unspecified: Secondary | ICD-10-CM

## 2021-11-24 DIAGNOSIS — N812 Incomplete uterovaginal prolapse: Secondary | ICD-10-CM

## 2021-11-24 NOTE — Progress Notes (Signed)
[facility]   Subjective:     Chief Complaint:  Chief Complaint  Patient presents with   Pessary Check   History of Present Illness: Patricia Spencer is a 66 y.o. female with stage II pelvic organ prolapse who presents for a pessary check. She is using a size #3 shaatz pessary. The pessary fell out after a few days. She wants to try the #4 ring that was ordered for her.   Previously tried: RWS, gehrung  Past Medical History: Patient  has a past medical history of Bronchitis and High cholesterol.   Past Surgical History: She  has no past surgical history on file.   Medications: She has a current medication list which includes the following prescription(s): alprazolam, meloxicam, pantoprazole, rosuvastatin, and vitamin d (ergocalciferol).   Allergies: Patient has No Known Allergies.   Social History: Patient  reports that she has never smoked. She has never used smokeless tobacco. She reports that she does not currently use alcohol. She reports that she does not use drugs.      Objective:    Physical Exam: BP 134/83    Pulse 98  Gen: No apparent distress, A&O x 3. Detailed Urogynecologic Evaluation:  Pelvic Exam: Normal external female genitalia; Bartholin's and Skene's glands normal in appearance; urethral meatus normal in appearance, no urethral masses or discharge.   A 2-1/2 in gellhorn pessary was placed and this was uncomfortable for her- put too much pressure on her rectum. A #4 RWS pessary was placed. It was comfortable, fit well, and stayed in placed with strong cough, valsalva and bending.    Prior exam:    POP-Q (05/08/21):    POP-Q   0                                            Aa   0                                           Ba   -7                                              C    3.5                                            Gh   2.5                                            Pb   10                                            tvl     -1  Ap   -1                                            Bp   -9                                              D         Assessment/Plan:    Assessment: Patricia Spencer is a 66 y.o. with stage III pelvic organ prolapse   Plan: - A #4 RWS pessary was placed today. She will leave in place.  - Will have her return 2 weeks for pessary check  All questions were answered.  Marguerita Beards, MD

## 2021-12-19 ENCOUNTER — Ambulatory Visit: Payer: 59 | Admitting: Obstetrics and Gynecology

## 2021-12-19 NOTE — Progress Notes (Deleted)
[facility] ? ? ?Subjective:  ?  ? ?Chief Complaint: No chief complaint on file. ? ?History of Present Illness: ?Patricia Spencer is a 66 y.o. female with stage II pelvic organ prolapse who presents for a pessary check. She is using a size #4 ring with support pessary. The pessary has been working well and she has no complaints. She {ACTION; IS/IS [id number]} using vaginal estrogen. She denies vaginal bleeding. ? ?Past Medical History: ?Patient  has a past medical history of Bronchitis and High cholesterol.  ? ?Past Surgical History: ?She  has no past surgical history on file.  ? ?Medications: ?She has a current medication list which includes the following prescription(s): alprazolam, meloxicam, pantoprazole, rosuvastatin, and vitamin d (ergocalciferol).  ? ?Allergies: ?Patient has No Known Allergies.  ? ?Social History: ?Patient  reports that she has never smoked. She has never used smokeless tobacco. She reports that she does not currently use alcohol. She reports that she does not use drugs.  ? ?  ? ?Objective:  ?  ?Physical Exam: ?There were no vitals taken for this visit. ?Gen: No apparent distress, A&O x 3. ?Detailed Urogynecologic Evaluation:  ?Pelvic Exam: Normal external female genitalia; Bartholin's and Skene's glands normal in appearance; urethral meatus {urethra:24773}, no urethral masses or discharge. The pessary was noted to be {in place:24774}. It was removed and cleaned. Speculum exam revealed {vaginal lesions:24775} in the vagina. The pessary was replaced. It was comfortable to the patient and fit well.  ? ?No flowsheet data found. ? ?Laboratory Results: ?Urine dipstick shows: {ua dip:315374::"negative for all components"}. ?  ? ?Assessment/Plan:  ?  ?Assessment: ?Patricia Spencer is a 66 y.o. with {PFD symptoms:24771} here for a pessary check. She is doing well. ? ?Plan: ?She will {pessary plan:24776}. She will continue to use {lubricant:24777}. She will follow-up in ***  {days/wks/mos/yrs:310907} for a pessary check or sooner as needed.  ?All questions were answered. ? ? ?Time Spent: ?  ? ?

## 2022-01-03 ENCOUNTER — Other Ambulatory Visit: Payer: Self-pay

## 2022-01-03 ENCOUNTER — Ambulatory Visit (INDEPENDENT_AMBULATORY_CARE_PROVIDER_SITE_OTHER): Payer: Self-pay | Admitting: Physician Assistant

## 2022-01-03 ENCOUNTER — Encounter: Payer: Self-pay | Admitting: Physician Assistant

## 2022-01-03 ENCOUNTER — Ambulatory Visit: Payer: 59 | Admitting: Family Medicine

## 2022-01-03 VITALS — BP 149/77 | HR 71 | Resp 16 | Ht 68.0 in | Wt 160.0 lb

## 2022-01-03 DIAGNOSIS — K047 Periapical abscess without sinus: Secondary | ICD-10-CM | POA: Insufficient documentation

## 2022-01-03 DIAGNOSIS — H6121 Impacted cerumen, right ear: Secondary | ICD-10-CM

## 2022-01-03 DIAGNOSIS — R7989 Other specified abnormal findings of blood chemistry: Secondary | ICD-10-CM

## 2022-01-03 DIAGNOSIS — Z79899 Other long term (current) drug therapy: Secondary | ICD-10-CM

## 2022-01-03 DIAGNOSIS — H9202 Otalgia, left ear: Secondary | ICD-10-CM | POA: Insufficient documentation

## 2022-01-03 MED ORDER — IBUPROFEN 600 MG PO TABS
600.0000 mg | ORAL_TABLET | Freq: Three times a day (TID) | ORAL | 0 refills | Status: AC | PRN
Start: 2022-01-03 — End: ?

## 2022-01-03 MED ORDER — DEBROX 6.5 % OT SOLN: 10.0000 [drp] | Freq: Two times a day (BID) | OTIC | 0 refills | Status: DC

## 2022-01-03 MED ORDER — AMOXICILLIN-POT CLAVULANATE 875-125 MG PO TABS: 1.0000 | ORAL_TABLET | Freq: Two times a day (BID) | ORAL | 0 refills | Status: AC

## 2022-01-03 NOTE — Patient Instructions (Signed)
Start augmentin ?Ibuprofen 600mg  up to three times a day for tooth pain ?Get in with dentist.  ? ? ? ? ?

## 2022-01-03 NOTE — Progress Notes (Signed)
? ?Subjective:  ? ? Patient ID: Patricia Spencer, female    DOB: 12/07/1955, 66 y.o.   MRN: [id number] ? ?HPI ?Pt is a 66 yo female who presents to the clinic with left ear pain. She denies any sinus pressure, headache, ST, cough. No ear drainage or hearing loss. She admits to needing a root canal in her upper left molar. No fever, chills, body aches. She is not taking anything for her symptoms.  ? ? ?.. ?Active Ambulatory Problems  ?  Diagnosis Date Noted  ? Hypercholesterolemia 04/21/2021  ? Low vitamin D level 04/21/2021  ? Oropharyngeal dysphagia 07/04/2021  ? Right ear impacted cerumen 01/03/2022  ? Tooth infection 01/03/2022  ? Left ear pain 01/03/2022  ? ?Resolved Ambulatory Problems  ?  Diagnosis Date Noted  ? No Resolved Ambulatory Problems  ? ?Past Medical History:  ?Diagnosis Date  ? Bronchitis   ? High cholesterol   ? ? ?Review of Systems ?See HPI.  ?   ?Objective:  ? Physical Exam ?Vitals reviewed.  ?Constitutional:   ?   Appearance: Normal appearance.  ?HENT:  ?   Head: Normocephalic.  ?   Right Ear: There is impacted cerumen.  ?   Left Ear: Tympanic membrane, ear canal and external ear normal. There is no impacted cerumen.  ?   Ears:  ?   Comments: Tenderness to palpation over left TMJ and maxillary sinuses. ?   Nose: Nose normal. No congestion or rhinorrhea.  ?   Mouth/Throat:  ?   Mouth: Mucous membranes are moist.  ?Eyes:  ?   Extraocular Movements: Extraocular movements intact.  ?   Conjunctiva/sclera: Conjunctivae normal.  ?   Pupils: Pupils are equal, round, and reactive to light.  ?Cardiovascular:  ?   Rate and Rhythm: Normal rate and regular rhythm.  ?Pulmonary:  ?   Effort: Pulmonary effort is normal.  ?   Breath sounds: Normal breath sounds.  ?Musculoskeletal:  ?   Right lower leg: No edema.  ?   Left lower leg: No edema.  ?Neurological:  ?   General: No focal deficit present.  ?   Mental Status: She is alert and oriented to person, place, and time.  ?Psychiatric:     ?   Mood and Affect: Mood  normal.  ? ? ? ? ?Marland Kitchen.Cerumen Removal Template: ?Indication: Cerumen impaction of the ear(s) ?Medical necessity statement: On physical examination, cerumen impairs clinically significant portions of the external auditory canal, and tympanic membrane. Noted obstructive, copious cerumen that cannot be removed without magnification and instrumentations requiring physician skills ?Consent: Discussed benefits and risks of procedure and verbal consent obtained ?Procedure: Patient was prepped for the procedure. Utilized an otoscope to assess and take note of the ear canal, the tympanic membrane, and the presence, amount, and placement of the cerumen. Gentle water irrigation and soft plastic curette was utilized to remove cerumen.  ?Post procedure examination shows cerumen was completely removed. Patient tolerated procedure well. The patient is made aware that they may experience temporary vertigo, temporary hearing loss, and temporary discomfort. If these symptom last for more than 24 hours to call the clinic or proceed to the ED. ? ? ?   ?Assessment & Plan:  ?..Warren was seen today for ear pain. ? ?Diagnoses and all orders for this visit: ? ?Tooth infection ?-     amoxicillin-clavulanate (AUGMENTIN) 875-125 MG tablet; Take 1 tablet by mouth 2 (two) times daily for 10 days. ?-     ibuprofen (ADVIL)  600 MG tablet; Take 1 tablet (600 mg total) by mouth every 8 (eight) hours as needed. For tooth pain. ? ?Low vitamin D level ?-     Vitamin D (25 hydroxy) ? ?Left ear pain ?-     ibuprofen (ADVIL) 600 MG tablet; Take 1 tablet (600 mg total) by mouth every 8 (eight) hours as needed. For tooth pain. ? ?Medication management ?-     CBC w/Diff/Platelet ?-     COMPLETE METABOLIC PANEL WITH GFR ? ?Right ear impacted cerumen ? ? ?Right ear irrigated due to cerumen impaction.  ? ?Left upper molar tooth infection treated with augmentin.  ?Discussed warm compresses and ibuprofen for pain.  ?Need to make appt with dentist ? ?Labs ordered  today. ? ? ?

## 2022-01-04 LAB — COMPLETE METABOLIC PANEL WITH GFR
AG Ratio: 1.5 (calc) (ref 1.0–2.5)
ALT: 12 U/L (ref 6–29)
AST: 16 U/L (ref 10–35)
Albumin: 4.3 g/dL (ref 3.6–5.1)
Alkaline phosphatase (APISO): 110 U/L (ref 37–153)
BUN: 10 mg/dL (ref 7–25)
CO2: 29 mmol/L (ref 20–32)
Calcium: 8.9 mg/dL (ref 8.6–10.4)
Chloride: 103 mmol/L (ref 98–110)
Creat: 0.85 mg/dL (ref 0.50–1.05)
Globulin: 2.8 g/dL (calc) (ref 1.9–3.7)
Glucose, Bld: 81 mg/dL (ref 65–99)
Potassium: 4 mmol/L (ref 3.5–5.3)
Sodium: 141 mmol/L (ref 135–146)
Total Bilirubin: 0.4 mg/dL (ref 0.2–1.2)
Total Protein: 7.1 g/dL (ref 6.1–8.1)
eGFR: 77 mL/min/{1.73_m2} (ref >=60)

## 2022-01-04 LAB — CBC WITH DIFFERENTIAL/PLATELET
Absolute Monocytes: 347 cells/uL (ref 200–950)
Basophils Absolute: 20 cells/uL (ref 0–200)
Basophils Relative: 0.4 %
Eosinophils Absolute: 92 cells/uL (ref 15–500)
Eosinophils Relative: 1.8 %
HCT: 42.7 % (ref 35.0–45.0)
Hemoglobin: 14.6 g/dL (ref 11.7–15.5)
Lymphs Abs: 2372 cells/uL (ref 850–3900)
MCH: 30.2 pg (ref 27.0–33.0)
MCHC: 34.2 g/dL (ref 32.0–36.0)
MCV: 88.4 fL (ref 80.0–100.0)
MPV: 12.4 fL (ref 7.5–12.5)
Monocytes Relative: 6.8 %
Neutro Abs: 2270 cells/uL (ref 1500–7800)
Neutrophils Relative %: 44.5 %
Platelets: 235 10*3/uL (ref 140–400)
RBC: 4.83 10*6/uL (ref 3.80–5.10)
RDW: 12.9 % (ref 11.0–15.0)
Total Lymphocyte: 46.5 %
WBC: 5.1 10*3/uL (ref 3.8–10.8)

## 2022-01-04 LAB — VITAMIN D 25 HYDROXY (VIT D DEFICIENCY, FRACTURES): Vit D, 25-Hydroxy: 19 ng/mL — ABNORMAL LOW (ref 30–100)

## 2022-01-04 NOTE — Progress Notes (Signed)
Kidney, liver, glucose look great.  ?Vitamin D is low. How much are you taking?  ?Hemoglobin looks great.

## 2022-01-05 NOTE — Progress Notes (Signed)
I don't mind taking her as a new patient if Dr. Ashley Royalty is ok with it.

## 2022-01-05 NOTE — Progress Notes (Signed)
Potassium is perfect so you do not need any extra potassium. Do you want 2000 units daily D3 OTC or over the counter.

## 2022-02-07 ENCOUNTER — Ambulatory Visit: Payer: Self-pay | Admitting: Physician Assistant

## 2022-02-07 ENCOUNTER — Telehealth: Payer: Self-pay | Admitting: Neurology

## 2022-02-07 ENCOUNTER — Ambulatory Visit: Payer: Self-pay | Admitting: Family Medicine

## 2022-02-07 NOTE — Telephone Encounter (Signed)
Patient no showed appt to switch PCP to [facility]. Dr. Zigmund Daniel will continue to be PCP (changed on chart) unless patient establishes care.  ?

## 2022-02-27 ENCOUNTER — Telehealth: Payer: Self-pay | Admitting: General Practice

## 2022-02-27 NOTE — Telephone Encounter (Signed)
Transition Care Management Follow-up Telephone Call ?Date of discharge and from where: 02/26/22 from [facility] ?How have you been since you were released from the hospital? Her oldest son passed away four weeks ago and she has been overwhelmed.  ?Any questions or concerns? No ? ?Items Reviewed: ?Did the pt receive and understand the discharge instructions provided? Yes  ?Medications obtained and verified? No  ?Other? No  ?Any new allergies since your discharge? No  ?Dietary orders reviewed? Yes ?Do you have support at home? Yes  ? ?Home Care and Equipment/Supplies: ?Were home health services ordered? no ? ?Functional Questionnaire: (I = Independent and D = Dependent) ?ADLs: I ? ?Bathing/Dressing- I ? ?Meal Prep- I ? ?Eating- I ? ?Maintaining continence- I ? ?Transferring/Ambulation- I ? ?Managing Meds- I ? ?Follow up appointments reviewed: ? ?PCP Hospital f/u appt confirmed? No. Patient will call back to schedule an appt once her insurance kicks in. ?[facility] f/u appt confirmed? No   ?Are transportation arrangements needed? No  ?If their condition worsens, is the pt aware to call PCP or go to the Emergency Dept.? Yes ?Was the patient provided with contact information for the PCP's office or ED? Yes ?Was to pt encouraged to call back with questions or concerns? Yes  ?

## 2022-12-19 ENCOUNTER — Encounter: Payer: Self-pay | Admitting: Family

## 2022-12-19 ENCOUNTER — Ambulatory Visit (INDEPENDENT_AMBULATORY_CARE_PROVIDER_SITE_OTHER): Payer: Commercial Managed Care - HMO | Admitting: Family

## 2022-12-19 VITALS — BP 126/80 | HR 83 | Temp 97.3°F | Ht 68.5 in | Wt 156.0 lb

## 2022-12-19 DIAGNOSIS — Z2821 Immunization not carried out because of patient refusal: Secondary | ICD-10-CM

## 2022-12-19 DIAGNOSIS — K047 Periapical abscess without sinus: Secondary | ICD-10-CM | POA: Diagnosis not present

## 2022-12-19 DIAGNOSIS — E782 Mixed hyperlipidemia: Secondary | ICD-10-CM | POA: Diagnosis not present

## 2022-12-19 DIAGNOSIS — Z7689 Persons encountering health services in other specified circumstances: Secondary | ICD-10-CM

## 2022-12-19 DIAGNOSIS — Z1211 Encounter for screening for malignant neoplasm of colon: Secondary | ICD-10-CM | POA: Diagnosis not present

## 2022-12-19 DIAGNOSIS — N8110 Cystocele, unspecified: Secondary | ICD-10-CM

## 2022-12-19 MED ORDER — AMOXICILLIN-POT CLAVULANATE 875-125 MG PO TABS: 1.0000 | ORAL_TABLET | Freq: Two times a day (BID) | ORAL | 0 refills | Status: AC

## 2022-12-19 NOTE — Progress Notes (Signed)
Provider: Marlowe Sax FNP-C   Arlene Genova, Nelda Bucks, NP  Patient Care Team: Cicely Ortner, Nelda Bucks, NP as PCP - General (Family Medicine)  Extended Emergency Contact Information Primary Emergency Contact: [place], [place] Phone: [phone] Relation: Sister Secondary Emergency Contact: Carita Pian Mobile Phone: [phone] Relation: Sister  Code Status:  Full Code  Goals of care: Advanced Directive information    12/19/2022   12:49 PM  Advanced Directives  Does Patient Have a Medical Advance Directive? No  Would patient like information on creating a medical advance directive? Yes (MAU/Ambulatory/Procedural Areas - Information given)     Chief Complaint  Patient presents with   Establish Care    New Patient to establish care. Not fasting today (FYI). Patient did not have pill bottle for Xanax.     HPI:  Pt is a 67 y.o. female seen today establish care here at [facility] for medical management of chronic diseases. States lost husband,mother and son.Has anxiety due to the loss.denies any depression. Not taking alprazolam.Does not like to take medication if she can avoid.   Hyperlipidemia - has take statin but made her have cramps on the back of the legs.  Complains of cholesterol deposit on elbows ,thighs and abdomen for several years.states was told swollen areas are cholesterol.  She complains of right shoulder pain and chronic hip/groin pain but declines x-ray due to coverage would like to wait for medicare insurance in December when she turns 67 yrs old.  Also complains of frontal headache for several years.Does not take tylenol.  She does not smoke or drinking alcohol.  She due for Tdap ,Influenza and shingles vaccine but declines.states will get Tdap but no other vaccine.  Past Medical History:  Diagnosis Date   Bronchitis    High cholesterol    History reviewed. No pertinent surgical history.  Allergies  Allergen Reactions    Crestor [Rosuvastatin] Other (See Comments)    Leg cramps     Allergies as of 12/19/2022       Reactions   Crestor [rosuvastatin] Other (See Comments)   Leg cramps         Medication List        Accurate as of December 19, 2022  1:06 PM. If you have any questions, ask your nurse or doctor.          STOP taking these medications    Debrox 6.5 % OTIC solution Generic drug: carbamide peroxide Stopped by: Sandrea Hughs, NP   rosuvastatin 40 MG tablet Commonly known as: CRESTOR Stopped by: Sandrea Hughs, NP       TAKE these medications    ALPRAZolam 0.25 MG tablet Commonly known as: Xanax Take 1 tablet by mouth up to tid prn anxiety   ibuprofen 600 MG tablet Commonly known as: ADVIL Take 1 tablet (600 mg total) by mouth every 8 (eight) hours as needed. For tooth pain.        Review of Systems  Constitutional:  Negative for appetite change, chills, fatigue, fever and unexpected weight change.  HENT:  Negative for congestion, dental problem, ear discharge, ear pain, facial swelling, hearing loss, nosebleeds, postnasal drip, rhinorrhea, sinus pressure, sinus pain, sneezing, sore throat, tinnitus and trouble swallowing.   Eyes:  Positive for visual disturbance. Negative for pain, discharge, redness and itching.       Wears eye glasses  Respiratory:  Negative for cough, chest tightness, shortness of breath and wheezing.   Cardiovascular:  Negative  for chest pain, palpitations and leg swelling.  Gastrointestinal:  Negative for abdominal distention, abdominal pain, blood in stool, constipation, diarrhea, nausea and vomiting.  Endocrine: Negative for cold intolerance, heat intolerance, polydipsia, polyphagia and polyuria.  Genitourinary:  Negative for difficulty urinating, dysuria, flank pain, frequency and urgency.  Musculoskeletal:  Negative for arthralgias, back pain, gait problem, joint swelling, myalgias, neck pain and neck stiffness.  Skin:  Negative for color  change, pallor, rash and wound.  Neurological:  Negative for dizziness, syncope, speech difficulty, weakness, light-headedness, numbness and headaches.  Hematological:  Does not bruise/bleed easily.  Psychiatric/Behavioral:  Negative for agitation, behavioral problems, confusion, hallucinations, self-injury, sleep disturbance and suicidal ideas. The patient is nervous/anxious.      There is no immunization history on file for this patient. Pertinent  Health Maintenance Due  Topic Date Due   COLONOSCOPY (Pts 45-42yr Insurance coverage will need to be confirmed)  Never done   INFLUENZA VACCINE  Never done   MAMMOGRAM  05/11/2023   PAP SMEAR-Modifier  04/19/2024      10/25/2018    7:26 AM 12/10/2018    6:40 PM 07/21/2019    7:02 PM 01/03/2022    1:21 PM 12/19/2022   12:49 PM  FNorwalkin the past year?    0 0  Was there an injury with Fall?    0 0  Fall Risk Category Calculator    0 0  Fall Risk Category (Retired)    Low   (RETIRED) Patient Fall Risk Level Low fall risk Low fall risk Low fall risk Low fall risk   Patient at Risk for Falls Due to     No Fall Risks  Fall risk Follow up    Falls prevention discussed;Falls evaluation completed Falls evaluation completed   Functional Status Survey:    Vitals:   12/19/22 1245  BP: 126/80  Pulse: 83  Temp: (!) 97.3 F (36.3 C)  TempSrc: Temporal  SpO2: 98%  Weight: 156 lb (70.8 kg)  Height: 5' 8.5" (1.74 m)   Body mass index is 23.37 kg/m. Physical Exam Vitals reviewed.  Constitutional:      General: She is not in acute distress.    Appearance: Normal appearance. She is normal weight. She is not ill-appearing or diaphoretic.  HENT:     Head: Normocephalic.     Right Ear: Tympanic membrane, ear canal and external ear normal. There is no impacted cerumen.     Left Ear: Tympanic membrane, ear canal and external ear normal. There is no impacted cerumen.     Nose: Nose normal. No congestion or rhinorrhea.      Mouth/Throat:     Mouth: Mucous membranes are moist.     Pharynx: Oropharynx is clear. No oropharyngeal exudate or posterior oropharyngeal erythema.  Eyes:     General: No scleral icterus.       Right eye: No discharge.        Left eye: No discharge.     Extraocular Movements: Extraocular movements intact.     Conjunctiva/sclera: Conjunctivae normal.     Pupils: Pupils are equal, round, and reactive to light.  Neck:     Vascular: No carotid bruit.  Cardiovascular:     Rate and Rhythm: Normal rate and regular rhythm.     Pulses: Normal pulses.     Heart sounds: Normal heart sounds. No murmur heard.    No friction rub. No gallop.  Pulmonary:     Effort: Pulmonary effort  is normal. No respiratory distress.     Breath sounds: Normal breath sounds. No wheezing, rhonchi or rales.  Chest:     Chest wall: No tenderness.  Abdominal:     General: Bowel sounds are normal. There is no distension.     Palpations: Abdomen is soft. There is no mass.     Tenderness: There is no abdominal tenderness. There is no right CVA tenderness, left CVA tenderness, guarding or rebound.  Musculoskeletal:        General: No swelling or tenderness. Normal range of motion.     Cervical back: Normal range of motion. No rigidity or tenderness.     Right lower leg: No edema.     Left lower leg: No edema.  Lymphadenopathy:     Cervical: No cervical adenopathy.  Skin:    General: Skin is warm and dry.     Coloration: Skin is not pale.     Findings: No bruising, erythema, lesion or rash.  Neurological:     Mental Status: She is alert and oriented to person, place, and time.     Cranial Nerves: No cranial nerve deficit.     Sensory: No sensory deficit.     Motor: No weakness.     Coordination: Coordination normal.     Gait: Gait normal.  Psychiatric:        Mood and Affect: Mood normal.        Speech: Speech normal.        Behavior: Behavior normal.        Thought Content: Thought content normal.         Judgment: Judgment normal.     Labs reviewed: Recent Labs    01/03/22 0000  NA 141  K 4.0  CL 103  CO2 29  GLUCOSE 81  BUN 10  CREATININE 0.85  CALCIUM 8.9   Recent Labs    01/03/22 0000  AST 16  ALT 12  BILITOT 0.4  PROT 7.1   Recent Labs    01/03/22 0000  WBC 5.1  NEUTROABS 2,270  HGB 14.6  HCT 42.7  MCV 88.4  PLT 235   Lab Results  Component Value Date   TSH 2.65 04/20/2021   Lab Results  Component Value Date   HGBA1C 5.4 04/20/2021   Lab Results  Component Value Date   CHOL 304 (H) 07/04/2021   HDL 49 (L) 07/04/2021   LDLCALC 230 (H) 07/04/2021   TRIG 117 07/04/2021   CHOLHDL 6.2 (H) 07/04/2021    Significant Diagnostic Results in last 30 days:  No results found.  Assessment/Plan 1. Encounter to establish care Available records reviewed,recommended immunization but declines Influenza and shingles vaccine.will get Tdap vaccine at the pharmacy.Recommended fasting labs.  2. Colon cancer screening Asymptomatic  Prefers Cologuard verse colonoscopy  - Cologuard  3. Mixed hyperlipidemia Previous LDL 239,chol 304 and TRG 117 Took Crestor but discontinued due to muscle cramps on the legs.she would like to give another try on another medication. - Dietary modification and exercise at least 3 times per week for 30 minutes.  - Lipid panel; Future  4. Tooth infection Right upper premolar tooth decay with gum erythema and tender to touch.No drainage or abscess noted. - advised to schedule appointment with the dentist for evaluation - OTC extra strength Tylenol as needed for pain  - start on Augmentin  - COMPLETE METABOLIC PANEL WITH GFR; Future - CBC with Differential/Platelet; Future - amoxicillin-clavulanate (AUGMENTIN) 875-125 MG tablet; Take 1 tablet  by mouth 2 (two) times daily for 7 days.  Dispense: 14 tablet; Refill: 0  5. Bladder prolapse, female, acquired Reports bladder prolapse has used caps in the past without any success. -  Ambulatory referral to Urology  6. Influenza vaccination declined Declined Influenza vaccine   Family/ staff Communication: Reviewed plan of care with patient verbalized understanding   Labs/tests ordered:  - Cologuard - COMPLETE METABOLIC PANEL WITH GFR; Future - CBC with Differential/Platelet; Future - Lipid panel; Future  Next Appointment : Return in about 1 year (around 12/19/2023) for fasting labs in one week or sooner , medical mangement of chronic issues.Sandrea Hughs, NP

## 2022-12-20 ENCOUNTER — Other Ambulatory Visit: Payer: Commercial Managed Care - HMO

## 2022-12-20 DIAGNOSIS — E782 Mixed hyperlipidemia: Secondary | ICD-10-CM

## 2022-12-20 DIAGNOSIS — K047 Periapical abscess without sinus: Secondary | ICD-10-CM

## 2022-12-21 LAB — CBC WITH DIFFERENTIAL/PLATELET
Absolute Monocytes: 257 cells/uL (ref 200–950)
Basophils Absolute: 20 cells/uL (ref 0–200)
Basophils Relative: 0.5 %
Eosinophils Absolute: 109 cells/uL (ref 15–500)
Eosinophils Relative: 2.8 %
HCT: 42.3 % (ref 35.0–45.0)
Hemoglobin: 14.4 g/dL (ref 11.7–15.5)
Lymphs Abs: 2422 cells/uL (ref 850–3900)
MCH: 30.3 pg (ref 27.0–33.0)
MCHC: 34 g/dL (ref 32.0–36.0)
MCV: 88.9 fL (ref 80.0–100.0)
MPV: 11.4 fL (ref 7.5–12.5)
Monocytes Relative: 6.6 %
Neutro Abs: 1092 cells/uL — ABNORMAL LOW (ref 1500–7800)
Neutrophils Relative %: 28 %
Platelets: 242 10*3/uL (ref 140–400)
RBC: 4.76 10*6/uL (ref 3.80–5.10)
RDW: 13.1 % (ref 11.0–15.0)
Total Lymphocyte: 62.1 %
WBC: 3.9 10*3/uL (ref 3.8–10.8)

## 2022-12-21 LAB — COMPLETE METABOLIC PANEL WITH GFR
AG Ratio: 1.6 (calc) (ref 1.0–2.5)
ALT: 18 U/L (ref 6–29)
AST: 17 U/L (ref 10–35)
Albumin: 4.3 g/dL (ref 3.6–5.1)
Alkaline phosphatase (APISO): 110 U/L (ref 37–153)
BUN: 10 mg/dL (ref 7–25)
CO2: 27 mmol/L (ref 20–32)
Calcium: 9 mg/dL (ref 8.6–10.4)
Chloride: 105 mmol/L (ref 98–110)
Creat: 0.82 mg/dL (ref 0.50–1.05)
Globulin: 2.7 g/dL (calc) (ref 1.9–3.7)
Glucose, Bld: 94 mg/dL (ref 65–99)
Potassium: 4.3 mmol/L (ref 3.5–5.3)
Sodium: 142 mmol/L (ref 135–146)
Total Bilirubin: 0.6 mg/dL (ref 0.2–1.2)
Total Protein: 7 g/dL (ref 6.1–8.1)
eGFR: 80 mL/min/{1.73_m2} (ref >=60)

## 2022-12-21 LAB — LIPID PANEL
Cholesterol: 306 mg/dL — ABNORMAL HIGH (ref <200)
HDL: 51 mg/dL (ref >=50)
LDL Cholesterol (Calc): 221 mg/dL (calc) — ABNORMAL HIGH
Non-HDL Cholesterol (Calc): 255 mg/dL (calc) — ABNORMAL HIGH (ref <130)
Total CHOL/HDL Ratio: 6 (calc) — ABNORMAL HIGH (ref <5.0)
Triglycerides: 168 mg/dL — ABNORMAL HIGH (ref <150)

## 2022-12-28 ENCOUNTER — Other Ambulatory Visit: Payer: Self-pay | Admitting: Family

## 2022-12-28 ENCOUNTER — Encounter: Payer: Self-pay | Admitting: Urology

## 2022-12-28 DIAGNOSIS — E782 Mixed hyperlipidemia: Secondary | ICD-10-CM

## 2023-02-11 ENCOUNTER — Ambulatory Visit: Payer: Commercial Managed Care - HMO | Admitting: Urology

## 2023-02-18 ENCOUNTER — Ambulatory Visit: Payer: Commercial Managed Care - HMO | Attending: Cardiology | Admitting: Cardiology

## 2023-02-18 ENCOUNTER — Encounter: Payer: Self-pay | Admitting: Cardiology

## 2023-02-18 VITALS — BP 124/80 | HR 92 | Ht 67.0 in | Wt 159.0 lb

## 2023-02-18 DIAGNOSIS — R0609 Other forms of dyspnea: Secondary | ICD-10-CM | POA: Diagnosis not present

## 2023-02-18 DIAGNOSIS — R072 Precordial pain: Secondary | ICD-10-CM

## 2023-02-18 DIAGNOSIS — Z8249 Family history of ischemic heart disease and other diseases of the circulatory system: Secondary | ICD-10-CM | POA: Diagnosis not present

## 2023-02-18 DIAGNOSIS — R079 Chest pain, unspecified: Secondary | ICD-10-CM | POA: Diagnosis not present

## 2023-02-18 DIAGNOSIS — E782 Mixed hyperlipidemia: Secondary | ICD-10-CM | POA: Diagnosis not present

## 2023-02-18 MED ORDER — PRAVASTATIN SODIUM 40 MG PO TABS: 40.0000 mg | ORAL_TABLET | Freq: Every evening | ORAL | 3 refills | Status: DC

## 2023-02-18 MED ORDER — NITROGLYCERIN 0.4 MG SL SUBL: 0.4000 mg | SUBLINGUAL_TABLET | SUBLINGUAL | 6 refills | Status: AC | PRN

## 2023-02-18 MED ORDER — METOPROLOL TARTRATE 25 MG PO TABS: 25.0000 mg | ORAL_TABLET | Freq: Two times a day (BID) | ORAL | 3 refills | Status: DC

## 2023-02-18 NOTE — Progress Notes (Unsigned)
Cardiology Consultation:    Date:  02/18/2023   ID:  Patricia Spencer, DOB [birth year], MRN [id number]  PCP:  Caesar Bookman, NP  Cardiologist:  Gypsy Balsam, MD   Referring MD: Caesar Bookman, NP   Chief Complaint  Patient presents with   Chest Pain   L arm numbness    History of Present Illness:    Patricia Spencer is a 67 y.o. female who is being seen today for the evaluation of chest pain and arm numbness at the request of Ngetich, Dinah C, NP.  Past medical history significant for severe hyperlipidemia with LDL in the neighborhood of 221, intolerant to Crestor, she also complained of having some shortness of breath at times has been getting for last 1 year or 2 especially while walking upstairs with some numbness in the left arm sometimes tightness in the chest that happens especially when she gets upset with her children or while walking.  She said she had difficulty walking up stairs or climbing hills.  And she is trying to avoid dose.  Does not do any exercises on the regular basis she is not on any special diet.  She never smoked but does have family history of premature coronary artery disease starting from her mother.  Have never had any heart test done.  Past Medical History:  Diagnosis Date   Bronchitis    High cholesterol     Past Surgical History:  Procedure Laterality Date   L foot surgery Left     Current Medications: Current Meds  Medication Sig   ibuprofen (ADVIL) 600 MG tablet Take 1 tablet (600 mg total) by mouth every 8 (eight) hours as needed. For tooth pain. (Patient taking differently: Take 600 mg by mouth every 8 (eight) hours as needed for mild pain or moderate pain. For tooth pain.)   [DISCONTINUED] ALPRAZolam (XANAX) 0.25 MG tablet Take 1 tablet by mouth up to tid prn anxiety (Patient taking differently: Take 0.25 mg by mouth 3 (three) times daily as needed for anxiety. Take 1 tablet by mouth up to tid prn anxiety)     Allergies:   Crestor  [rosuvastatin]   Social History   Socioeconomic History   Marital status: Widowed    Spouse name: Not on file   Number of children: Not on file   Years of education: Not on file   Highest education level: Not on file  Occupational History   Not on file  Tobacco Use   Smoking status: Never   Smokeless tobacco: Never  Vaping Use   Vaping Use: Never used  Substance and Sexual Activity   Alcohol use: Not Currently   Drug use: Never   Sexual activity: Not Currently    Birth control/protection: Post-menopausal  Other Topics Concern   Not on file  Social History Narrative   Not on file   Social Determinants of Health   Financial Resource Strain: Not on file  Food Insecurity: Not on file  Transportation Needs: Not on file  Physical Activity: Not on file  Stress: Not on file  Social Connections: Not on file     Family History: The patient's family history includes Colon cancer in her father; Heart disease in her mother; High Cholesterol in her sister; Hypertension in her father. ROS:   Please see the history of present illness.    All 14 point review of systems negative except as described per history of present illness.  EKGs/Labs/Other Studies Reviewed:    The  following studies were reviewed today:   EKG:  EKG is  ordered today.  The ekg ordered today demonstrates normal sinus rhythm, normal P interval, left axis deviation, prolonged QT but less than 500  Recent Labs: 12/20/2022: ALT 18; BUN 10; Creat 0.82; Hemoglobin 14.4; Platelets 242; Potassium 4.3; Sodium 142  Recent Lipid Panel    Component Value Date/Time   CHOL 306 (H) 12/20/2022 0821   TRIG 168 (H) 12/20/2022 0821   HDL 51 12/20/2022 0821   CHOLHDL 6.0 (H) 12/20/2022 0821   LDLCALC 221 (H) 12/20/2022 1610    Physical Exam:    VS:  BP 124/80 (BP Location: Left Arm, Patient Position: Sitting)   Pulse 92   Ht 5\' 7"  (1.702 m)   Wt 159 lb (72.1 kg)   SpO2 96%   BMI 24.90 kg/m     Wt Readings from  Last 3 Encounters:  02/18/23 159 lb (72.1 kg)  12/19/22 156 lb (70.8 kg)  01/03/22 160 lb (72.6 kg)     GEN:  Well nourished, well developed in no acute distress HEENT: Normal NECK: No JVD; No carotid bruits LYMPHATICS: No lymphadenopathy CARDIAC: RRR, no murmurs, no rubs, no gallops RESPIRATORY:  Clear to auscultation without rales, wheezing or rhonchi  ABDOMEN: Soft, non-tender, non-distended MUSCULOSKELETAL:  No edema; No deformity  SKIN: Warm and dry NEUROLOGIC:  Alert and oriented x 3 PSYCHIATRIC:  Normal affect   ASSESSMENT:    1. Mixed hyperlipidemia   2. Dyspnea on exertion   3. Family history of coronary arteriosclerosis   4. 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  I suspect in the future we will have to go on PCSK9 agent. Dyspnea on exertion with some very worrisome symptoms.  We had a long discussion about what to do with the situation he is she is afraid of testing however hopefully I was able to convince her sufficiently to pursue coronary CT angio.  I described test to her including all risk benefits as well as alternatives.  In the meantime I will start her with aspirin 81 daily, will give her metoprolol 25 twice daily, will give her nitroglycerin.  With instruction to go to the emergency room if nitroglycerin does not help with the pain. Dyspnea on exertion echocardiogram will be  done. Overall more about her a lot more with dealing with familial hyperlipidemia, I was able to convince her to have coronary CT angio will rule out significant obstructive disease we will manage her lipids aggressively.  I anticipate in the future need to use PCSK9 agent   Medication Adjustments/Labs and Tests Ordered: Current medicines are reviewed at length with the patient today.  Concerns regarding medicines are outlined above.  No orders of the defined types were placed in this encounter.  No orders of the defined types were placed in this encounter.   Signed, Molly Maduro  Abelino Derrick, MD, [facility]. 02/18/2023 3:19 PM    [facility]

## 2023-02-18 NOTE — Patient Instructions (Addendum)
Medication Instructions:   START: Metoprolol Tartrate 25mg  1 tablet twice daily  TAKE: Metoprolol 100mg  (4- 25mg  tablets)2 hours prior to CT Scan  START: Pravastatin 40mg  1 tablet daily  START: Nitroglycerin Use nitroglycerin 1 tablet placed under the tongue at the first sign of chest pain or an angina attack. 1 tablet may be used every 5 minutes as needed, for up to 15 minutes. Do not take more than 3 tablets in 15 minutes. If pain persist call 911 or go to the nearest ED.    *If you need a refill on your cardiac medications before your next appointment, please call your pharmacy*   Lab Work: Your physician recommends that you return for lab work in: 1 week prior to CT Scan You can come Monday through Friday 8:00 am to 11:30 am and 1:00 to 4:00. You do not need to make an appointment as the order has already been placed. The labs you are going to have done are BMET.   If you have labs (blood work) drawn today and your tests are completely normal, you will receive your results only by: MyChart Message (if you have MyChart) OR A paper copy in the mail If you have any lab test that is abnormal or we need to change your treatment, we will call you to review the results.   Testing/Procedures:   Your cardiac CT will be scheduled at one of the below locations:   [facility] [address] [place], [place] [address] [phone]   At [facility], please arrive at the [facility] and Children's Entrance (Entrance C2) of [facility] 30 minutes prior to test start time. You can use the FREE valet parking offered at entrance C (encouraged to control the heart rate for the test)  Proceed to the [facility] Radiology Department (first floor) to check-in and test prep.  All radiology patients and guests should use entrance C2 at [facility], accessed from [facility], even though the hospital's physical address listed is [address].      Please follow these instructions carefully (unless otherwise directed):   On the Night Before the Test: Be sure to Drink plenty of water. Do not consume any caffeinated/decaffeinated beverages or chocolate 12 hours prior to your test. Do not take any antihistamines 12 hours prior to your test.   On the Day of the Test: Drink plenty of water until 1 hour prior to the test. Do not eat any food 4 hours prior to the test. You may take your regular medications prior to the test.  Take metoprolol (Lopressor) two hours prior to test. FEMALES- please wear underwire-free bra if available, avoid dresses & tight clothin       After the Test: Drink plenty of water. After receiving IV contrast, you may experience a mild flushed feeling. This is normal. On occasion, you may experience a mild rash up to 24 hours after the test. This is not dangerous. If this occurs, you can take Benadryl 25 mg and increase your fluid intake. If you experience trouble breathing, this can be serious. If it is severe call 911 IMMEDIATELY. If it is mild, please call our office. If you take any of these medications: Glipizide/Metformin, Avandament, Glucavance, please do not take 48 hours after completing test unless otherwise instructed.  We will call to schedule your test 2-4 weeks out understanding that some insurance companies will need an authorization prior to the service being performed.  For non-scheduling related questions, please contact the cardiac imaging nurse navigator should you have any questions/concerns: Rockwell Alexandria, Cardiac Imaging Nurse Navigator Larey Brick, Cardiac Imaging Nurse Navigator [facility] Direct Office Dial: [phone]   For scheduling needs, including cancellations and rescheduling, please call [place], [phone].   Your physician has requested that you have an echocardiogram. Echocardiography is a painless test that uses sound  waves to create images of your heart. It provides your doctor with information about the size and shape of your heart and how well your heart's chambers and valves are working. This procedure takes approximately one hour. There are no restrictions for this procedure. Please do NOT wear cologne, perfume, aftershave, or lotions (deodorant is allowed). Please arrive 15 minutes prior to your appointment time.    Follow-Up: At [facility], you and your health needs are our priority.  As part of our continuing mission to provide you with exceptional heart care, we have created designated Provider Care Teams.  These Care Teams include your primary Cardiologist (physician) and Advanced Practice Providers (APPs -  Physician Assistants and Nurse Practitioners) who all work together to provide you with the care you need, when you need it.  We recommend signing up for the patient portal called "MyChart".  Sign up information is provided on this After Visit Summary.  MyChart is used to connect with patients for Virtual Visits (Telemedicine).  Patients are able to view lab/test results, encounter notes, upcoming appointments, etc.  Non-urgent messages can be sent to your provider as well.   To learn more about what you can do with MyChart, go to [email or web address].    Your next appointment:   6 week(s)  The format for your next appointment:   In Person  Provider:   Gypsy Balsam, MD   Other Instructions Cardiac CT Angiogram A cardiac CT angiogram is a procedure to look at the heart and the area around the heart. It may be done to help find the cause of chest pains or other symptoms of heart disease. During this procedure, a substance called contrast dye is injected into the blood vessels in the area to be checked. A large X-ray machine, called a CT scanner, then takes detailed pictures of the heart and the surrounding area. The procedure is also sometimes called a coronary CT angiogram, coronary  artery scanning, or CTA. A cardiac CT angiogram allows the health care provider to see how well blood is flowing to and from the heart. The health care provider will be able to see if there are any problems, such as: Blockage or narrowing of the coronary arteries in the heart. Fluid around the heart. Signs of weakness or disease in the muscles, valves, and tissues of the heart. Tell a health care provider about: Any allergies you have. This is especially important if you have had a previous allergic reaction to contrast dye. All medicines you are taking, including vitamins, herbs, eye drops, creams, and over-the-counter medicines. Any blood disorders you have. Any surgeries you have had. Any medical conditions you have. Whether you are pregnant or may be pregnant. Any anxiety disorders, chronic pain, or other conditions you have that may increase your stress or prevent you from lying still. What are the risks? Generally, this is a safe procedure. However, problems may occur, including: Bleeding. Infection. Allergic reactions to medicines or dyes. Damage to other structures or organs. Kidney damage from the contrast dye that is used. Increased risk of cancer from radiation  exposure. This risk is low. Talk with your health care provider about: The risks and benefits of testing. How you can receive the lowest dose of radiation. What happens before the procedure? Wear comfortable clothing and remove any jewelry, glasses, dentures, and hearing aids. Follow instructions from your health care provider about eating and drinking. This may include: For 12 hours before the procedure -- avoid caffeine. This includes tea, coffee, soda, energy drinks, and diet pills. Drink plenty of water or other fluids that do not have caffeine in them. Being well hydrated can prevent complications. For 4-6 hours before the procedure -- stop eating and drinking. The contrast dye can cause nausea, but this is less  likely if your stomach is empty. Ask your health care provider about changing or stopping your regular medicines. This is especially important if you are taking diabetes medicines, blood thinners, or medicines to treat problems with erections (erectile dysfunction). What happens during the procedure?  Hair on your chest may need to be removed so that small sticky patches called electrodes can be placed on your chest. These will transmit information that helps to monitor your heart during the procedure. An IV will be inserted into one of your veins. You might be given a medicine to control your heart rate during the procedure. This will help to ensure that good images are obtained. You will be asked to lie on an exam table. This table will slide in and out of the CT machine during the procedure. Contrast dye will be injected into the IV. You might feel warm, or you may get a metallic taste in your mouth. You will be given a medicine called nitroglycerin. This will relax or dilate the arteries in your heart. The table that you are lying on will move into the CT machine tunnel for the scan. The person running the machine will give you instructions while the scans are being done. You may be asked to: Keep your arms above your head. Hold your breath. Stay very still, even if the table is moving. When the scanning is complete, you will be moved out of the machine. The IV will be removed. The procedure may vary among health care providers and hospitals. What can I expect after the procedure? After your procedure, it is common to have: A metallic taste in your mouth from the contrast dye. A feeling of warmth. A headache from the nitroglycerin. Follow these instructions at home: Take over-the-counter and prescription medicines only as told by your health care provider. If you are told, drink enough fluid to keep your urine pale yellow. This will help to flush the contrast dye out of your body. Most  people can return to their normal activities right after the procedure. Ask your health care provider what activities are safe for you. It is up to you to get the results of your procedure. Ask your health care provider, or the department that is doing the procedure, when your results will be ready. Keep all follow-up visits as told by your health care provider. This is important. Contact a health care provider if: You have any symptoms of allergy to the contrast dye. These include: Shortness of breath. Rash or hives. A racing heartbeat. Summary A cardiac CT angiogram is a procedure to look at the heart and the area around the heart. It may be done to help find the cause of chest pains or other symptoms of heart disease. During this procedure, a large X-ray machine, called a CT  scanner, takes detailed pictures of the heart and the surrounding area after a contrast dye has been injected into blood vessels in the area. Ask your health care provider about changing or stopping your regular medicines before the procedure. This is especially important if you are taking diabetes medicines, blood thinners, or medicines to treat erectile dysfunction. If you are told, drink enough fluid to keep your urine pale yellow. This will help to flush the contrast dye out of your body. This information is not intended to replace advice given to you by your health care provider. Make sure you discuss any questions you have with your health care provider. Document Revised: 05/27/2019 Document Reviewed: 05/27/2019 Elsevier Patient Education  2020 [employer].

## 2023-03-18 ENCOUNTER — Other Ambulatory Visit: Payer: Self-pay

## 2023-03-18 ENCOUNTER — Ambulatory Visit (HOSPITAL_COMMUNITY): Payer: Commercial Managed Care - HMO | Attending: Cardiology

## 2023-03-18 DIAGNOSIS — R0609 Other forms of dyspnea: Secondary | ICD-10-CM | POA: Diagnosis present

## 2023-03-18 DIAGNOSIS — I081 Rheumatic disorders of both mitral and tricuspid valves: Secondary | ICD-10-CM

## 2023-03-18 LAB — ECHOCARDIOGRAM COMPLETE
Area-P 1/2: 3.68 cm2
S' Lateral: 3.45 cm

## 2023-03-21 ENCOUNTER — Telehealth: Payer: Self-pay

## 2023-03-21 NOTE — Telephone Encounter (Unsigned)
Call placed to pt - LM on voicemail for pt to call office.  Have open appts with Dr. Sherron Monday for sooner appt 03/25/23.

## 2023-03-29 ENCOUNTER — Telehealth: Payer: Self-pay

## 2023-03-29 NOTE — Telephone Encounter (Signed)
LVM to call regarding ECHO results

## 2023-04-02 ENCOUNTER — Ambulatory Visit: Payer: Commercial Managed Care - HMO | Admitting: Cardiology

## 2023-04-03 ENCOUNTER — Telehealth: Payer: Self-pay

## 2023-04-03 NOTE — Telephone Encounter (Signed)
Results reviewed with pt as per Dr. Krasowski's note.  Pt verbalized understanding and had no additional questions. Routed to PCP  

## 2023-04-15 ENCOUNTER — Ambulatory Visit: Payer: Commercial Managed Care - HMO | Admitting: Urology

## 2023-05-20 ENCOUNTER — Encounter: Payer: Self-pay | Admitting: Urology

## 2023-05-20 ENCOUNTER — Ambulatory Visit (INDEPENDENT_AMBULATORY_CARE_PROVIDER_SITE_OTHER): Payer: Commercial Managed Care - HMO | Admitting: Urology

## 2023-05-20 VITALS — BP 131/80 | HR 85 | Ht 67.0 in | Wt 155.6 lb

## 2023-05-20 DIAGNOSIS — N8110 Cystocele, unspecified: Secondary | ICD-10-CM

## 2023-05-20 LAB — MICROSCOPIC EXAMINATION: RBC, Urine: NONE SEEN /hpf (ref 0–2)

## 2023-05-20 LAB — URINALYSIS, COMPLETE
Bilirubin, UA: NEGATIVE
Glucose, UA: NEGATIVE
Ketones, UA: NEGATIVE
Leukocytes,UA: NEGATIVE
Nitrite, UA: NEGATIVE
Protein,UA: NEGATIVE
RBC, UA: NEGATIVE
Specific Gravity, UA: <1.005 — ABNORMAL LOW (ref 1.005–1.030)
Urobilinogen, Ur: 0.2 mg/dL (ref 0.2–1.0)
pH, UA: 5.5 (ref 5.0–7.5)

## 2023-05-20 MED ORDER — NITROFURANTOIN MACROCRYSTAL 100 MG PO CAPS
100.0000 mg | ORAL_CAPSULE | Freq: Two times a day (BID) | ORAL | 0 refills | Status: DC
Start: 2023-05-20 — End: 2023-05-24

## 2023-05-20 NOTE — Progress Notes (Signed)
05/20/2023 9:54 AM   Patricia Spencer 02/12/1956 [id number]  Referring provider: Caesar Bookman, NP [address] [place],  [place] [address]  Chief Complaint  Patient presents with   New Patient (Initial Visit)    HPI: I was consulted to assess the patient's prolapse that she has had for many years.  Sometimes she reduces it but it spontaneously reduced when she is supine.  She feels bulging at the introitus.  She has not had a hysterectomy.  She has rare urge incontinence and possibly rare stress incontinence with coughing sneezing.  No bedwetting.  Does not wear a pad  She voids approximate every 3 hours and gets up once or twice a night.  Flow is good 1 prolapse is minimal but it does vary  She thinks she might be getting a bladder infection but she was very nonspecific.  She thinks he feels a little uncomfortable in the suprapubic area.  She was nonspecific when I asked her if she was bothered by her prolapse symptoms  No history of kidney stones bladder surgery or bladder infections.  She wondered if she started to get a bladder infection today.  No neurologic issues.  Bowel function normal   PMH: Past Medical History:  Diagnosis Date   Bronchitis    High cholesterol     Surgical History: Past Surgical History:  Procedure Laterality Date   L foot surgery Left     Home Medications:  Allergies as of 05/20/2023       Reactions   Crestor [rosuvastatin] Other (See Comments)   Leg cramps         Medication List        Accurate as of May 20, 2023  9:54 AM. If you have any questions, ask your nurse or doctor.          STOP taking these medications    metoprolol tartrate 25 MG tablet Commonly known as: LOPRESSOR Stopped by: Lorin Picket A Trini Soldo   pravastatin 40 MG tablet Commonly known as: PRAVACHOL Stopped by: Lorin Picket A Ryian Lynde       TAKE these medications    ibuprofen 600 MG tablet Commonly known as: ADVIL Take 1 tablet (600 mg total) by mouth  every 8 (eight) hours as needed. For tooth pain. What changed: reasons to take this   nitroGLYCERIN 0.4 MG SL tablet Commonly known as: NITROSTAT Place 1 tablet (0.4 mg total) under the tongue every 5 (five) minutes as needed for chest pain.        Allergies:  Allergies  Allergen Reactions   Crestor [Rosuvastatin] Other (See Comments)    Leg cramps     Family History: Family History  Problem Relation Age of Onset   Heart disease Mother    Colon cancer Father    Hypertension Father    High Cholesterol Sister     Social History:  reports that she has never smoked. She has never used smokeless tobacco. She reports that she does not currently use alcohol. She reports that she does not use drugs.  ROS:                                        Physical Exam: BP 131/80   Pulse 85   Ht 5\' 7"  (1.702 m)   Wt 70.6 kg   BMI 24.37 kg/m   Constitutional:  Alert and oriented, No acute distress.  HEENT: [place] AT, moist mucus membranes.  Trachea midline, no masses. Cardiovascular: No clubbing, cyanosis, or edema. Respiratory: Normal respiratory effort, no increased work of breathing. GI: Abdomen is soft, nontender, nondistended, no abdominal masses GU: On pelvic examination she had a grade 3 cystocele that just reach beyond the urethrovesical angle associated with a moderate central defect.  She could not cough or bear down significantly.  Uterus descended from 8 or 9 cm to approximately 6 cm.  She had a small distal grade 1 rectocele Skin: No rashes, bruises or suspicious lesions. Lymph: No cervical or inguinal adenopathy. Neurologic: Grossly intact, no focal deficits, moving all 4 extremities. Psychiatric: Normal mood and affect.  Laboratory Data: Lab Results  Component Value Date   WBC 3.9 12/20/2022   HGB 14.4 12/20/2022   HCT 42.3 12/20/2022   MCV 88.9 12/20/2022   PLT 242 12/20/2022    Lab Results  Component Value Date   CREATININE 0.82 12/20/2022     No results found for: "PSA"  No results found for: "TESTOSTERONE"  Lab Results  Component Value Date   HGBA1C 5.4 04/20/2021    Urinalysis    Component Value Date/Time   COLORURINE YELLOW 09/27/2018 1231   APPEARANCEUR CLEAR 09/27/2018 1231   LABSPEC 1.025 09/27/2018 1231   PHURINE 5.5 09/27/2018 1231   GLUCOSEU NEGATIVE 09/27/2018 1231   HGBUR NEGATIVE 09/27/2018 1231   BILIRUBINUR Negative 05/08/2021 1027   KETONESUR NEGATIVE 09/27/2018 1231   PROTEINUR Negative 05/08/2021 1027   PROTEINUR NEGATIVE 09/27/2018 1231   UROBILINOGEN 0.2 05/08/2021 1027   NITRITE Negative 05/08/2021 1027   NITRITE NEGATIVE 09/27/2018 1231   LEUKOCYTESUR Negative 05/08/2021 1027    Pertinent Imaging: Urine reviewed and sent for culture.  Chart reviewed  Assessment & Plan: Patient has prolapse symptoms.  She has minimal to rare incontinence.  Picture drawn.  If she had surgery she would best benefit from a robotic hysterectomy and cystocele repair.  I do not think she would need a distal rectocele repair.  Role of surgery and urodynamics and watchful waiting and pessary discussed.  Natural history discussed  Patient wanted me to treat her for a possible urinary tract infection.  I called in Macrodantin 100 mg twice a day for 7 days.  She would like to try a pessary and I will get her into our clinic since she does not have a local gynecologist here or in [facility] where she lives.  She is not sexually active.  Otherwise watchful waiting for prolapse.  Call if culture differs  1. Bladder prolapse, female, acquired  - Urinalysis, Complete   No follow-ups on file.  Martina Sinner, MD  [facility] [address], [address] [place], [place] [address] [phone]

## 2023-05-24 ENCOUNTER — Other Ambulatory Visit: Payer: Self-pay | Admitting: *Deleted

## 2023-05-24 MED ORDER — CEPHALEXIN 500 MG PO CAPS: 500.0000 mg | ORAL_CAPSULE | Freq: Three times a day (TID) | ORAL | 0 refills | Status: AC

## 2023-06-03 ENCOUNTER — Ambulatory Visit: Payer: Commercial Managed Care - HMO | Admitting: Urology

## 2023-06-18 ENCOUNTER — Ambulatory Visit: Payer: Commercial Managed Care - HMO | Admitting: Cardiology

## 2023-06-18 ENCOUNTER — Telehealth: Payer: Self-pay | Admitting: Cardiology

## 2023-06-18 NOTE — Telephone Encounter (Signed)
Patient is calling because Dr. Bing Matter wanted her to follow up in 6 weeks and would like to know why that was. Please advise.

## 2023-06-19 NOTE — Telephone Encounter (Signed)
Spoke with the patient, I advised her the reason for the requested appt. She declined to schedule and said if she feels the need to come back she will call. Patient declined to proceed with CT. Patient requested the results of her Echo, results discussed again with the patient.

## 2023-07-19 ENCOUNTER — Telehealth: Payer: Self-pay | Admitting: Cardiology

## 2023-07-19 NOTE — Telephone Encounter (Signed)
Patient would like to switch from Dr. Bing Matter to Dr. Jens Som. Please confirm transfer.

## 2023-11-24 DIAGNOSIS — I10 Essential (primary) hypertension: Secondary | ICD-10-CM | POA: Diagnosis not present

## 2023-11-24 DIAGNOSIS — R519 Headache, unspecified: Secondary | ICD-10-CM | POA: Diagnosis not present

## 2023-12-19 ENCOUNTER — Ambulatory Visit (INDEPENDENT_AMBULATORY_CARE_PROVIDER_SITE_OTHER): Payer: Commercial Managed Care - HMO | Admitting: Family

## 2023-12-19 ENCOUNTER — Encounter: Payer: Self-pay | Admitting: Family

## 2023-12-19 VITALS — BP 128/77 | HR 77 | Temp 97.1°F | Resp 18 | Ht 67.0 in | Wt 155.0 lb

## 2023-12-19 DIAGNOSIS — Z1231 Encounter for screening mammogram for malignant neoplasm of breast: Secondary | ICD-10-CM

## 2023-12-19 DIAGNOSIS — E782 Mixed hyperlipidemia: Secondary | ICD-10-CM

## 2023-12-19 DIAGNOSIS — F411 Generalized anxiety disorder: Secondary | ICD-10-CM | POA: Diagnosis not present

## 2023-12-19 DIAGNOSIS — G8929 Other chronic pain: Secondary | ICD-10-CM

## 2023-12-19 DIAGNOSIS — K219 Gastro-esophageal reflux disease without esophagitis: Secondary | ICD-10-CM

## 2023-12-19 DIAGNOSIS — R030 Elevated blood-pressure reading, without diagnosis of hypertension: Secondary | ICD-10-CM

## 2023-12-19 DIAGNOSIS — R7989 Other specified abnormal findings of blood chemistry: Secondary | ICD-10-CM | POA: Diagnosis not present

## 2023-12-19 DIAGNOSIS — Z113 Encounter for screening for infections with a predominantly sexual mode of transmission: Secondary | ICD-10-CM

## 2023-12-19 DIAGNOSIS — E559 Vitamin D deficiency, unspecified: Secondary | ICD-10-CM | POA: Diagnosis not present

## 2023-12-19 DIAGNOSIS — Z1159 Encounter for screening for other viral diseases: Secondary | ICD-10-CM | POA: Diagnosis not present

## 2023-12-19 DIAGNOSIS — N8110 Cystocele, unspecified: Secondary | ICD-10-CM

## 2023-12-19 DIAGNOSIS — H6123 Impacted cerumen, bilateral: Secondary | ICD-10-CM

## 2023-12-19 DIAGNOSIS — Z1211 Encounter for screening for malignant neoplasm of colon: Secondary | ICD-10-CM

## 2023-12-19 DIAGNOSIS — M25511 Pain in right shoulder: Secondary | ICD-10-CM

## 2023-12-19 MED ORDER — ALPRAZOLAM 0.5 MG PO TABS: 0.5000 mg | ORAL_TABLET | Freq: Two times a day (BID) | ORAL | 0 refills | Status: DC | PRN

## 2023-12-19 MED ORDER — OMEPRAZOLE 20 MG PO CPDR: 20.0000 mg | DELAYED_RELEASE_CAPSULE | Freq: Two times a day (BID) | ORAL | 3 refills | Status: AC

## 2023-12-19 NOTE — Progress Notes (Signed)
 Location:      Place of Service:    Provider: Tammatha Cobb FNP-C   Selma Rodelo, Donalee Citrin, NP  Patient Care Team: Lexianna Weinrich, Donalee Citrin, NP as PCP - General (Family Medicine)  Extended Emergency Contact Information Primary Emergency Contact: Edwards,Sabrina Home Phone: [phone] Relation: Sister Secondary Emergency Contact: Marshall,Sophia Mobile Phone: [phone] Relation: Sister  Code Status:  Full Code  Goals of care: Advanced Directive information    12/19/2023    8:35 AM  Advanced Directives  Does Patient Have a Medical Advance Directive? No  Would patient like information on creating a medical advance directive? No - Patient declined     Chief Complaint  Patient presents with   Medical Management of Chronic Issues    Routine Visit    Immunizations     DTAP. NCIR verified   Health Maintenance    HIV Screening, Hepatitis C Screening, Mammogram and Colonoscopy. ( All orders are pending to be sign)    Discussed the use of AI scribe software for clinical note transcription with the patient, who gave verbal consent to proceed.  History of Present Illness   Patricia Spencer is a 68 year old female who presents for a routine visit. Also due for Medicare annual wellness exam. Will schedule appointment since she recently turned 68 yrs old.  She has not had a primary care provider recently and is seeking guidance on finding one closer to her home. She is due for several preventive health measures, including a mammogram and colonoscopy.  She has a history of elevated blood pressure, which she attributes to situational stress and anxiety. She experiences occasional anxiety and has previously used Alprazolam 0.5 mg as needed, which she finds effective. She does not take regular blood pressure medication and manages her blood pressure through lifestyle adjustments. No regular use of supplements. Family history includes hypertension in both parents.  She experiences symptoms of acid  reflux, including a burning sensation and difficulty swallowing. She occasionally uses over-the-counter antacids but does not take regular medication for this condition. Certain foods, like tomato sauce, exacerbate her symptoms.  She has a history of chest tightness, which she associates with anxiety and stress. She was previously prescribed nitroglycerin but has never used it. She experiences shortness of breath, which she attributes to lack of exercise.  She reports bilateral shoulder pain, more pronounced on the right side, with associated cracking sounds. She has not had imaging studies for this issue.  She mentions a history of earwax buildup, which has previously required treatment. She also reports a sensation of phlegm in her throat, which she attributes to drainage.  She has a family history of colon cancer, with her father having been affected.   Past Medical History:  Diagnosis Date   Bronchitis    High cholesterol    Past Surgical History:  Procedure Laterality Date   L foot surgery Left     Allergies  Allergen Reactions   Crestor [Rosuvastatin] Other (See Comments)    Leg cramps     Allergies as of 12/19/2023       Reactions   Crestor [rosuvastatin] Other (See Comments)   Leg cramps         Medication List        Accurate as of December 19, 2023 10:25 PM. If you have any questions, ask your nurse or doctor.          ALPRAZolam 0.5 MG tablet Commonly known as: XANAX Take 1 tablet (0.5 mg total) by mouth 2 (  two) times daily as needed for anxiety. Started by: Donalee Citrin Elisabetta Mishra   ibuprofen 600 MG tablet Commonly known as: ADVIL Take 1 tablet (600 mg total) by mouth every 8 (eight) hours as needed. For tooth pain. What changed: reasons to take this   nitroGLYCERIN 0.4 MG SL tablet Commonly known as: NITROSTAT Place 1 tablet (0.4 mg total) under the tongue every 5 (five) minutes as needed for chest pain.   omeprazole 20 MG capsule Commonly known as:  PRILOSEC Take 1 capsule (20 mg total) by mouth 2 (two) times daily before a meal. Started by: Donalee Citrin Kinaya Hilliker        Review of Systems  Constitutional:  Negative for appetite change, chills, fatigue, fever and unexpected weight change.  HENT:  Negative for congestion, dental problem, ear discharge, ear pain, facial swelling, hearing loss, nosebleeds, postnasal drip, rhinorrhea, sinus pressure, sinus pain, sneezing, sore throat, tinnitus and trouble swallowing.   Eyes:  Negative for pain, discharge, redness, itching and visual disturbance.  Respiratory:  Negative for cough, chest tightness, shortness of breath and wheezing.   Cardiovascular:  Negative for chest pain, palpitations and leg swelling.  Gastrointestinal:  Negative for abdominal distention, abdominal pain, blood in stool, constipation, diarrhea, nausea and vomiting.  Endocrine: Negative for cold intolerance, heat intolerance, polydipsia, polyphagia and polyuria.  Genitourinary:  Negative for difficulty urinating, dysuria, flank pain, frequency and urgency.  Musculoskeletal:  Positive for arthralgias. Negative for back pain, gait problem, joint swelling, myalgias, neck pain and neck stiffness.       Bilateral shoulder pain worst on right shoulder   Skin:  Negative for color change, pallor, rash and wound.  Neurological:  Negative for dizziness, syncope, speech difficulty, weakness, light-headedness, numbness and headaches.  Hematological:  Does not bruise/bleed easily.  Psychiatric/Behavioral:  Negative for agitation, behavioral problems, confusion, hallucinations, self-injury, sleep disturbance and suicidal ideas. The patient is nervous/anxious.      There is no immunization history on file for this patient. Pertinent  Health Maintenance Due  Topic Date Due   Colonoscopy  Never done   MAMMOGRAM  05/11/2023   INFLUENZA VACCINE  01/13/2024 (Originally 05/16/2023)   DEXA SCAN  Completed      12/10/2018    6:40 PM 07/21/2019     7:02 PM 01/03/2022    1:21 PM 12/19/2022   12:49 PM 12/19/2023    9:13 AM  Fall Risk  Falls in the past year?   0 0 0  Was there an injury with Fall?   0 0 0  Fall Risk Category Calculator   0 0 0  Fall Risk Category (Retired)   Low    (RETIRED) Patient Fall Risk Level Low fall risk Low fall risk Low fall risk    Patient at Risk for Falls Due to    No Fall Risks No Fall Risks  Fall risk Follow up   Falls prevention discussed;Falls evaluation completed Falls evaluation completed    Functional Status Survey:    Vitals:   12/19/23 0843  BP: 128/77  Pulse: 77  Resp: 18  Temp: (!) 97.1 F (36.2 C)  SpO2: 96%  Weight: 155 lb (70.3 kg)  Height: 5\' 7"  (1.702 m)   Body mass index is 24.28 kg/m. Physical Exam VITALS: BP- 202/158 GENERAL: Alert, cooperative, well developed, no acute distress. HEENT: Normocephalic, normal oropharynx, moist mucous membranes. Bilateral  ear canal obstructed with cerumen. Nose normal. NECK: Thyroid normal. CHEST: Clear to auscultation bilaterally. No wheezes, rhonchi, or crackles.  CARDIOVASCULAR: Normal heart rate and rhythm, S1 and S2 normal without murmurs. ABDOMEN: Soft, non-tender, non-distended, without organomegaly. Normal bowel sounds. EXTREMITIES: No cyanosis or edema. MUSCULOSKELETAL: Right shoulder pain on abduction. NEUROLOGICAL: Cranial nerves grossly intact, moves all extremities without gross motor or sensory deficit. SKIN: No rash, No lesion or erythema  PSYCHIATRY/BEHAVIORAL: Anxious   Labs reviewed: Recent Labs    12/20/22 0821  NA 142  K 4.3  CL 105  CO2 27  GLUCOSE 94  BUN 10  CREATININE 0.82  CALCIUM 9.0   Recent Labs    12/20/22 0821  AST 17  ALT 18  BILITOT 0.6  PROT 7.0   Recent Labs    12/20/22 0821  WBC 3.9  NEUTROABS 1,092*  HGB 14.4  HCT 42.3  MCV 88.9  PLT 242   Lab Results  Component Value Date   TSH 2.65 04/20/2021   Lab Results  Component Value Date   HGBA1C 5.4 04/20/2021   Lab Results   Component Value Date   CHOL 306 (H) 12/20/2022   HDL 51 12/20/2022   LDLCALC 221 (H) 12/20/2022   TRIG 168 (H) 12/20/2022   CHOLHDL 6.0 (H) 12/20/2022    Significant Diagnostic Results in last 30 days:  No results found.  Assessment/Plan  Hypertension Intermittent elevated blood pressure, likely situational and related to anxiety. No consistent antihypertensive medication use. Monitors blood pressure at home and reports situational triggers. - Monitor blood pressure at home and record readings - Report if blood pressure consistently exceeds 140/90 mmHg  Generalized Anxiety Disorder  Experiences anxiety leading to elevated blood pressure. Previously prescribed alprazolam 0.5 mg, found effective. Discussed need for controlled substance contract. - Prescribe alprazolam 0.5 mg, 30-day supply, twice daily as needed - Sign controlled substance contract  Gastroesophageal Reflux Disease (GERD) Reports acid reflux and dysphagia. No current medication. Discussed omeprazole and dietary modifications. - Prescribe omeprazole once daily before meals - Avoid acidic and spicy foods - Take ibuprofen with food to avoid stomach irritation  Shoulder Pain Right shoulder pain with movement, likely arthritis. No prior imaging. Discussed potential need for x-ray. - Order x-ray of right shoulder  Bladder Prolapse Symptoms consistent with bladder prolapse. Requests pessary fitting. Discussed urologist referral. - Refer to urologist for evaluation and pessary fitting  Cerumen Impaction Both ears occluded with wax, affecting hearing and equilibrium. Discussed removal options including ENT referral. - Refer to ENT for ear wax removal  General Health Maintenance Routine visit for 68 year old female. Declined flu, shingles, pneumonia, and COVID-19 vaccines. Agreed to mammogram and colonoscopy. Discussed importance of screenings. - Schedule mamm[facility] at [facility] - Refer to  gastroenterologist for colonoscopy - Screen for hepatitis C with blood work - Order blood work for cholesterol, CBC, kidney, liver, and electrolytes  Follow-up - Schedule follow-up visit for lab results and further management - Expect phone calls for gastroenterologist and ENT referrals - Print after-visit summary.   Family/ staff Communication: Reviewed plan of care with patient verbalized understanding   Labs/tests ordered: - CBC with Differential/Platelet - CMP with eGFR([facility]) - TSH - Lipid panel - Vitamin D level  - Hep C Antibody - HIV Antibody (routine testing w rflx)  Next Appointment : Return in about 1 year (around 12/18/2024) for medical mangement of chronic issues., Annual wellness visit soon, Bilateral ear lavage.   Spent 39 minutes of Face to face and non-face to face with patient  >50% time spent counseling; reviewing medical record; tests; labs; documentation and developing future  plan of care.   Caesar Bookman, NP

## 2023-12-20 LAB — COMPLETE METABOLIC PANEL WITH GFR
AG Ratio: 1.6 (calc) (ref 1.0–2.5)
ALT: 21 U/L (ref 6–29)
AST: 16 U/L (ref 10–35)
Albumin: 4.2 g/dL (ref 3.6–5.1)
Alkaline phosphatase (APISO): 111 U/L (ref 37–153)
BUN: 11 mg/dL (ref 7–25)
CO2: 29 mmol/L (ref 20–32)
Calcium: 9.2 mg/dL (ref 8.6–10.4)
Chloride: 104 mmol/L (ref 98–110)
Creat: 0.81 mg/dL (ref 0.50–1.05)
Globulin: 2.7 g/dL (ref 1.9–3.7)
Glucose, Bld: 89 mg/dL (ref 65–99)
Potassium: 4.3 mmol/L (ref 3.5–5.3)
Sodium: 141 mmol/L (ref 135–146)
Total Bilirubin: 0.6 mg/dL (ref 0.2–1.2)
Total Protein: 6.9 g/dL (ref 6.1–8.1)
eGFR: 81 mL/min/{1.73_m2} (ref >=60)

## 2023-12-20 LAB — CBC WITH DIFFERENTIAL/PLATELET
Absolute Lymphocytes: 2227 {cells}/uL (ref 850–3900)
Absolute Monocytes: 258 {cells}/uL (ref 200–950)
Basophils Absolute: 19 {cells}/uL (ref 0–200)
Basophils Relative: 0.5 %
Eosinophils Absolute: 99 {cells}/uL (ref 15–500)
Eosinophils Relative: 2.6 %
HCT: 42.5 % (ref 35.0–45.0)
Hemoglobin: 14 g/dL (ref 11.7–15.5)
MCH: 29.7 pg (ref 27.0–33.0)
MCHC: 32.9 g/dL (ref 32.0–36.0)
MCV: 90 fL (ref 80.0–100.0)
MPV: 11.4 fL (ref 7.5–12.5)
Monocytes Relative: 6.8 %
Neutro Abs: 1197 {cells}/uL — ABNORMAL LOW (ref 1500–7800)
Neutrophils Relative %: 31.5 %
Platelets: 239 10*3/uL (ref 140–400)
RBC: 4.72 10*6/uL (ref 3.80–5.10)
RDW: 12.7 % (ref 11.0–15.0)
Total Lymphocyte: 58.6 %
WBC: 3.8 10*3/uL (ref 3.8–10.8)

## 2023-12-20 LAB — LIPID PANEL
Cholesterol: 287 mg/dL — ABNORMAL HIGH (ref <200)
HDL: 48 mg/dL — ABNORMAL LOW (ref >=50)
LDL Cholesterol (Calc): 203 mg/dL — ABNORMAL HIGH
Non-HDL Cholesterol (Calc): 239 mg/dL — ABNORMAL HIGH (ref <130)
Total CHOL/HDL Ratio: 6 (calc) — ABNORMAL HIGH (ref <5.0)
Triglycerides: 184 mg/dL — ABNORMAL HIGH (ref <150)

## 2023-12-20 LAB — HEPATITIS C ANTIBODY: Hepatitis C Ab: NONREACTIVE

## 2023-12-20 LAB — TSH: TSH: 1.24 m[IU]/L (ref 0.40–4.50)

## 2023-12-20 LAB — HIV ANTIBODY (ROUTINE TESTING W REFLEX): HIV 1&2 Ab, 4th Generation: NONREACTIVE

## 2023-12-24 ENCOUNTER — Ambulatory Visit (INDEPENDENT_AMBULATORY_CARE_PROVIDER_SITE_OTHER): Admitting: Family

## 2023-12-24 ENCOUNTER — Encounter: Payer: Self-pay | Admitting: Family

## 2023-12-24 VITALS — BP 122/74 | HR 96 | Temp 97.6°F | Resp 19 | Ht 67.0 in | Wt 159.6 lb

## 2023-12-24 DIAGNOSIS — F411 Generalized anxiety disorder: Secondary | ICD-10-CM | POA: Diagnosis not present

## 2023-12-24 DIAGNOSIS — H6123 Impacted cerumen, bilateral: Secondary | ICD-10-CM

## 2023-12-24 LAB — VITAMIN D 1,25 DIHYDROXY
Vitamin D 1, 25 (OH)2 Total: 50 pg/mL (ref 18–72)
Vitamin D2 1, 25 (OH)2: 27 pg/mL
Vitamin D3 1, 25 (OH)2: 23 pg/mL

## 2023-12-24 MED ORDER — DEBROX 6.5 % OT SOLN: 5.0000 [drp] | Freq: Two times a day (BID) | OTIC | 0 refills | Status: AC

## 2023-12-24 MED ORDER — ALPRAZOLAM 0.5 MG PO TABS: 0.5000 mg | ORAL_TABLET | Freq: Two times a day (BID) | ORAL | 0 refills | Status: AC | PRN

## 2023-12-26 ENCOUNTER — Encounter (HOSPITAL_BASED_OUTPATIENT_CLINIC_OR_DEPARTMENT_OTHER): Payer: Self-pay

## 2023-12-26 ENCOUNTER — Inpatient Hospital Stay (HOSPITAL_BASED_OUTPATIENT_CLINIC_OR_DEPARTMENT_OTHER): Admission: RE | Admit: 2023-12-26 | Source: Ambulatory Visit

## 2023-12-29 NOTE — Progress Notes (Signed)
 Provider: Richarda Blade FNP-C  Chanse Kagel, Donalee Citrin, NP  Patient Care Team: Lisett Dirusso, Donalee Citrin, NP as PCP - General (Family Medicine)  Extended Emergency Contact Information Primary Emergency Contact: [facility] Phone: [phone] Relation: Sister Secondary Emergency Contact: Marshall,Sophia Mobile Phone: [phone] Relation: Sister  Code Status:  Full Code  Goals of care: Advanced Directive information    12/19/2023    8:35 AM  Advanced Directives  Does Patient Have a Medical Advance Directive? No  Would patient like information on creating a medical advance directive? No - Patient declined     Chief Complaint  Patient presents with   Follow-up    Ear lavage   Discussed the use of AI scribe software for clinical note transcription with the patient, who gave verbal consent to proceed.  History of Present Illness   The patient presents with earwax buildup and discomfort in the right ear.  She has undergone ear flushing for both ears, with the left ear clearing up completely. However, she continues to experience discomfort and a sensation of fullness in the right ear, which has not fully cleared.  She attempted to remove the earwax using tweezers, which provided some relief in the left ear but not the right. No pain or ringing in the ears is reported. She has been experiencing frequent sneezing but denies any recent respiratory infections.  Additionally, she reports a bad taste in her mouth and loud growling sounds from her stomach, though her bowel movements are regular.   Past Medical History:  Diagnosis Date   Bronchitis    High cholesterol    Past Surgical History:  Procedure Laterality Date   L foot surgery Left     Allergies  Allergen Reactions   Crestor [Rosuvastatin] Other (See Comments)    Leg cramps     Outpatient Encounter Medications as of 12/24/2023  Medication Sig   [EXPIRED] carbamide peroxide (DEBROX) 6.5 % OTIC solution Place 5 drops  into the right ear 2 (two) times daily for 4 days.   ibuprofen (ADVIL) 600 MG tablet Take 1 tablet (600 mg total) by mouth every 8 (eight) hours as needed. For tooth pain. (Patient taking differently: Take 600 mg by mouth every 8 (eight) hours as needed for mild pain (pain score 1-3) or moderate pain (pain score 4-6). For tooth pain.)   nitroGLYCERIN (NITROSTAT) 0.4 MG SL tablet Place 1 tablet (0.4 mg total) under the tongue every 5 (five) minutes as needed for chest pain.   omeprazole (PRILOSEC) 20 MG capsule Take 1 capsule (20 mg total) by mouth 2 (two) times daily before a meal.   [DISCONTINUED] ALPRAZolam (XANAX) 0.5 MG tablet Take 1 tablet (0.5 mg total) by mouth 2 (two) times daily as needed for anxiety.   ALPRAZolam (XANAX) 0.5 MG tablet Take 1 tablet (0.5 mg total) by mouth 2 (two) times daily as needed for anxiety.   No facility-administered encounter medications on file as of 12/24/2023.    Review of Systems  Constitutional:  Negative for appetite change, chills, fatigue, fever and unexpected weight change.  HENT:  Positive for sneezing. Negative for congestion, dental problem, ear discharge, ear pain, hearing loss, nosebleeds, postnasal drip, rhinorrhea, sinus pressure, sinus pain, sore throat, tinnitus and trouble swallowing.   Eyes:  Negative for pain, discharge, redness, itching and visual disturbance.  Respiratory:  Negative for cough, chest tightness, shortness of breath and wheezing.   Cardiovascular:  Negative for chest pain, palpitations and leg swelling.  Gastrointestinal:  Negative for abdominal  distention, abdominal pain, blood in stool, constipation, diarrhea, nausea and vomiting.  Musculoskeletal:  Negative for arthralgias, back pain, gait problem, joint swelling and myalgias.  Skin:  Negative for color change, pallor and rash.  Neurological:  Negative for dizziness, weakness, light-headedness, numbness and headaches.     There is no immunization history on file for this  patient. Pertinent  Health Maintenance Due  Topic Date Due   Colonoscopy  Never done   MAMMOGRAM  05/11/2023   INFLUENZA VACCINE  01/13/2024 (Originally 05/16/2023)   DEXA SCAN  Completed      12/10/2018    6:40 PM 07/21/2019    7:02 PM 01/03/2022    1:21 PM 12/19/2022   12:49 PM 12/19/2023    9:13 AM  Fall Risk  Falls in the past year?   0 0 0  Was there an injury with Fall?   0 0 0  Fall Risk Category Calculator   0 0 0  Fall Risk Category (Retired)   Low    (RETIRED) Patient Fall Risk Level Low fall risk Low fall risk Low fall risk    Patient at Risk for Falls Due to    No Fall Risks No Fall Risks  Fall risk Follow up   Falls prevention discussed;Falls evaluation completed Falls evaluation completed    Functional Status Survey:    Vitals:   12/24/23 0954  BP: 122/74  Pulse: 96  Resp: 19  Temp: 97.6 F (36.4 C)  SpO2: 96%  Weight: 159 lb 9.6 oz (72.4 kg)  Height: 5\' 7"  (1.702 m)   Body mass index is 25 kg/m. Physical Exam GENERAL: Alert, cooperative, well developed, no acute distress. HEENT: Normocephalic, normal oropharynx, moist mucous membranes. Left ear clear of cerumen. Right ear with significant cerumen. No frontal or maxillary sinus tenderness. NECK: No cervical lymphadenopathy. CHEST: Clear to auscultation bilaterally, no wheezes, rhonchi, or crackles. CARDIOVASCULAR: Normal heart rate and rhythm, S1 and S2 normal without murmurs. ABDOMEN: Soft, non-tender, non-distended, without organomegaly, normal bowel sounds. EXTREMITIES: No cyanosis or edema. NEUROLOGICAL: Cranial nerves grossly intact, moves all extremities without gross motor or sensory deficit.   Labs reviewed: Recent Labs    12/19/23 1012  NA 141  K 4.3  CL 104  CO2 29  GLUCOSE 89  BUN 11  CREATININE 0.81  CALCIUM 9.2   Recent Labs    12/19/23 1012  AST 16  ALT 21  BILITOT 0.6  PROT 6.9   Recent Labs    12/19/23 1012  WBC 3.8  NEUTROABS 1,197*  HGB 14.0  HCT 42.5  MCV 90.0  PLT  239   Lab Results  Component Value Date   TSH 1.24 12/19/2023   Lab Results  Component Value Date   HGBA1C 5.4 04/20/2021   Lab Results  Component Value Date   CHOL 287 (H) 12/19/2023   HDL 48 (L) 12/19/2023   LDLCALC 203 (H) 12/19/2023   TRIG 184 (H) 12/19/2023   CHOLHDL 6.0 (H) 12/19/2023    Significant Diagnostic Results in last 30 days:  No results found.  Assessment/Plan  Cerumen impaction Persistent cerumen impaction in the right ear despite previous removal attempts. The left ear is clear. She reports no otalgia or tinnitus, but experiences fullness and occasional tingling. Ear drops will be used to soften the cerumen before further removal to prevent discomfort and potential ear canal damage. Peroxide use was discussed to aid in cerumen breakdown and facilitate flushing. - Use ear drops in the right ear  for 4-5 days to soften cerumen. - Schedule follow-up for ear flushing after 4-5 days of ear drop use. - Advise using a small cotton ball to prevent ear drops from soiling sheets. - If cerumen persists, consider ENT consultation on March 28 for further removal.  Vitamin D deficiency She inquired about vitamin D levels and potential deficiency. Vitamin D test results are pending, and no supplementation has been prescribed yet. - Await vitamin D test results before prescribing supplementation.   Family/ staff Communication: Reviewed plan of care with patient verbalized understanding   Labs/tests ordered: None   Next Appointment: Return in about 1 week (around 12/31/2023) for right cerumen imapction .   Total time: 28 minutes. Greater than 50% of total time spent doing patient education regarding right cerumen impaction,Vitamin D def and health maintenance including symptom/medication management.   Caesar Bookman, NP

## 2024-01-01 ENCOUNTER — Ambulatory Visit: Admitting: Family

## 2024-01-08 ENCOUNTER — Encounter: Admitting: Family

## 2024-01-08 NOTE — Progress Notes (Signed)
   This encounter was created in error - please disregard. No show

## 2024-01-09 ENCOUNTER — Telehealth (INDEPENDENT_AMBULATORY_CARE_PROVIDER_SITE_OTHER): Payer: Self-pay | Admitting: Physician Assistant

## 2024-01-09 NOTE — Telephone Encounter (Signed)
 Confirmed appt date and location with patient for 01/10/2024.

## 2024-01-10 ENCOUNTER — Ambulatory Visit (INDEPENDENT_AMBULATORY_CARE_PROVIDER_SITE_OTHER): Payer: Self-pay | Admitting: Physician Assistant

## 2024-01-10 DIAGNOSIS — H6121 Impacted cerumen, right ear: Secondary | ICD-10-CM

## 2024-01-10 MED ORDER — FLUTICASONE PROPIONATE 50 MCG/ACT NA SUSP: 2.0000 | Freq: Every day | NASAL | 6 refills | Status: AC

## 2024-01-10 MED ORDER — LORATADINE 10 MG PO TABS: 10.0000 mg | ORAL_TABLET | Freq: Every day | ORAL | 11 refills | Status: AC

## 2024-01-10 NOTE — Progress Notes (Signed)
 Dear Dr. Elam Dutch, Here is my assessment for our mutual patient, Patricia Spencer. Thank you for allowing me the opportunity to care for your patient. Please do not hesitate to contact me should you have any other questions. Sincerely, Burna Forts PA-C  Otolaryngology Clinic Note Referring provider: Dr. Elam Dutch HPI:  Patricia Spencer is a 68 y.o. female kindly referred by Dr. Elam Dutch   The patient is a 68 year old female seen in our office for evaluation of cerumen impaction.  The patient notes that she went to see her primary care provider for routine evaluation.  They noted that she had bilateral cerumen impactions.  They attempted irrigation, they were able to clean the left ear but the right ear had a persistent cerumen impaction.  She denies any significant pain in the ear, no drainage, no infections.  She denies any history of hearing loss prior to or presently.  She denies any history of recurrent ear infections.  She did have a tonsillectomy previously.  She does report a history of seasonal allergies, she is not currently taking any medications for this.    Independent Review of Additional Tests or Records:  Office visit note on 12/24/2023 describing the cerumen impactions.   PMH/Meds/All/SocHx/FamHx/ROS:   Past Medical History:  Diagnosis Date   Bronchitis    High cholesterol      Past Surgical History:  Procedure Laterality Date   L foot surgery Left     Family History  Problem Relation Age of Onset   Heart disease Mother    Colon cancer Father    Hypertension Father    High Cholesterol Sister      Social Connections: Not on file      Current Outpatient Medications:    fluticasone (FLONASE) 50 MCG/ACT nasal spray, Place 2 sprays into both nostrils daily., Disp: 16 g, Rfl: 6   loratadine (CLARITIN) 10 MG tablet, Take 1 tablet (10 mg total) by mouth daily., Disp: 30 tablet, Rfl: 11   ALPRAZolam (XANAX) 0.5 MG tablet, Take 1 tablet (0.5 mg total) by mouth 2 (two) times  daily as needed for anxiety., Disp: 20 tablet, Rfl: 0   ibuprofen (ADVIL) 600 MG tablet, Take 1 tablet (600 mg total) by mouth every 8 (eight) hours as needed. For tooth pain. (Patient taking differently: Take 600 mg by mouth every 8 (eight) hours as needed for mild pain (pain score 1-3) or moderate pain (pain score 4-6). For tooth pain.), Disp: 90 tablet, Rfl: 0   nitroGLYCERIN (NITROSTAT) 0.4 MG SL tablet, Place 1 tablet (0.4 mg total) under the tongue every 5 (five) minutes as needed for chest pain., Disp: 25 tablet, Rfl: 6   omeprazole (PRILOSEC) 20 MG capsule, Take 1 capsule (20 mg total) by mouth 2 (two) times daily before a meal., Disp: 60 capsule, Rfl: 3   Physical Exam:   There were no vitals taken for this visit.  Pertinent Findings  CN II-XII intact Right EAC with cerumen impaction left clear, TM intact with well pneumatized middle ear space Weber 512: equal Rinne 512: AC > BC b/l  Anterior rhinoscopy: Septum midline; bilateral inferior turbinates with minimal hypertrophy No lesions of oral cavity/oropharynx; dentition within normal limits No obviously palpable neck masses/lymphadenopathy/thyromegaly No respiratory distress or stridor  Seprately Identifiable Procedures:  Procedure: bilateral ear microscopy and cerumen removal using microscope (CPT 16109) - Mod 25 Pre-procedure diagnosis: unilateral cerumen impaction right external auditory canal Post-procedure diagnosis: same Indication: bilateral cerumen impaction; given patient's otologic complaints and history as well  as for improved and comprehensive examination of external ear and tympanic membrane, bilateral otologic examination using microscope was performed and impacted cerumen removed  Procedure: Patient was placed semi-recumbent. Both ear canals were examined using the microscope with findings above. Cerumen removed from the right external auditory canal using suction and currette with improvement in EAC examination and  patency. Left: EAC was patent. TM was intact . Middle ear was aerated. Drainage: none Right: EAC was patent. TM was intact . Middle ear was aerated . Drainage: none Patient tolerated the procedure well.   Impression & Plans:  Patricia Spencer is a 68 y.o. female with the following   Cerumen impaction-  The patient presented today with cerumen impaction.  This was removed without difficulty.  I see no signs of infection.  The patient will reach out to the office if she develops any new or worsening signs or symptoms.  She does not feel she has any significant change to her baseline hearing and did not elect for audiology evaluation today.    - f/u PRN   Thank you for allowing me the opportunity to care for your patient. Please do not hesitate to contact me should you have any other questions.  Sincerely, Burna Forts PA-C [facility] Phone: [phone] Fax: [phone]  01/10/2024, 1:39 PM

## 2024-01-14 ENCOUNTER — Encounter: Payer: Self-pay | Admitting: Gastroenterology

## 2024-01-20 ENCOUNTER — Ambulatory Visit (HOSPITAL_BASED_OUTPATIENT_CLINIC_OR_DEPARTMENT_OTHER): Payer: Self-pay

## 2024-01-29 ENCOUNTER — Telehealth (HOSPITAL_BASED_OUTPATIENT_CLINIC_OR_DEPARTMENT_OTHER): Payer: Self-pay

## 2024-02-05 ENCOUNTER — Ambulatory Visit (AMBULATORY_SURGERY_CENTER): Admitting: *Deleted

## 2024-02-05 VITALS — Ht 65.0 in | Wt 150.0 lb

## 2024-02-05 DIAGNOSIS — Z1211 Encounter for screening for malignant neoplasm of colon: Secondary | ICD-10-CM

## 2024-02-05 MED ORDER — NA SULFATE-K SULFATE-MG SULF 17.5-3.13-1.6 GM/177ML PO SOLN: 1.0000 | Freq: Once | ORAL | 0 refills | Status: AC

## 2024-02-05 NOTE — Progress Notes (Signed)
 Pre visit completed over telephone as a 3-way call with patient's sister. Instructions sent through secure email to both patient and her sister,  and mailed.  Identifying info deleted  Patient with high level of anxiety receiving anesthesia.  Discussed specific concerns and answered all questions.    No egg or soy allergy known to patient  No issues known to pt with past sedation with any surgeries or procedures Patient denies ever being told they had issues or difficulty with intubation  No FH of Malignant Hyperthermia Pt is not on diet pills Pt is not on  home 02  Pt is not on blood thinners  Pt denies issues with constipation  No A fib or A flutter Have any cardiac testing pending--NO Pt instructed to use [email or web address] or GoodRx for a price reduction on prep

## 2024-02-10 ENCOUNTER — Encounter: Payer: Self-pay | Admitting: Gastroenterology

## 2024-02-19 ENCOUNTER — Encounter: Admitting: Gastroenterology

## 2024-03-09 DIAGNOSIS — H9203 Otalgia, bilateral: Secondary | ICD-10-CM | POA: Diagnosis not present

## 2024-03-09 DIAGNOSIS — R519 Headache, unspecified: Secondary | ICD-10-CM | POA: Diagnosis not present

## 2024-03-09 DIAGNOSIS — J029 Acute pharyngitis, unspecified: Secondary | ICD-10-CM | POA: Diagnosis not present

## 2024-03-09 DIAGNOSIS — H6693 Otitis media, unspecified, bilateral: Secondary | ICD-10-CM | POA: Diagnosis not present

## 2024-07-13 DIAGNOSIS — E559 Vitamin D deficiency, unspecified: Secondary | ICD-10-CM | POA: Diagnosis not present

## 2024-07-13 DIAGNOSIS — Z114 Encounter for screening for human immunodeficiency virus [HIV]: Secondary | ICD-10-CM | POA: Diagnosis not present

## 2024-07-13 DIAGNOSIS — Z79899 Other long term (current) drug therapy: Secondary | ICD-10-CM | POA: Diagnosis not present

## 2024-07-13 DIAGNOSIS — Z1329 Encounter for screening for other suspected endocrine disorder: Secondary | ICD-10-CM | POA: Diagnosis not present

## 2024-07-13 DIAGNOSIS — Z139 Encounter for screening, unspecified: Secondary | ICD-10-CM | POA: Diagnosis not present

## 2024-07-13 DIAGNOSIS — R0602 Shortness of breath: Secondary | ICD-10-CM | POA: Diagnosis not present

## 2024-07-13 DIAGNOSIS — Z0000 Encounter for general adult medical examination without abnormal findings: Secondary | ICD-10-CM | POA: Diagnosis not present

## 2024-07-13 DIAGNOSIS — E7800 Pure hypercholesterolemia, unspecified: Secondary | ICD-10-CM | POA: Diagnosis not present

## 2024-07-13 DIAGNOSIS — Z6824 Body mass index (BMI) 24.0-24.9, adult: Secondary | ICD-10-CM | POA: Diagnosis not present

## 2024-07-13 DIAGNOSIS — Z1159 Encounter for screening for other viral diseases: Secondary | ICD-10-CM | POA: Diagnosis not present

## 2024-07-28 DIAGNOSIS — E7800 Pure hypercholesterolemia, unspecified: Secondary | ICD-10-CM | POA: Diagnosis not present

## 2024-07-28 DIAGNOSIS — R1310 Dysphagia, unspecified: Secondary | ICD-10-CM | POA: Diagnosis not present

## 2024-07-28 DIAGNOSIS — F4320 Adjustment disorder, unspecified: Secondary | ICD-10-CM | POA: Diagnosis not present

## 2024-07-28 DIAGNOSIS — E559 Vitamin D deficiency, unspecified: Secondary | ICD-10-CM | POA: Diagnosis not present

## 2024-07-28 DIAGNOSIS — Z6824 Body mass index (BMI) 24.0-24.9, adult: Secondary | ICD-10-CM | POA: Diagnosis not present

## 2024-12-21 ENCOUNTER — Encounter: Payer: Self-pay | Admitting: Family
